# Patient Record
Sex: Female | Born: 1965 | Race: White | Hispanic: No | Marital: Married | Smoking: Never smoker
Health system: Southern US, Community
[De-identification: ages and names within clinical notes are randomized; demographics above are authoritative.]

## PROBLEM LIST (undated history)

## (undated) DIAGNOSIS — R7989 Other specified abnormal findings of blood chemistry: Secondary | ICD-10-CM

## (undated) HISTORY — DX: Other specified abnormal findings of blood chemistry: R79.89

## (undated) HISTORY — PX: OTHER SURGICAL HISTORY: SHX169

---

## 2015-08-13 DIAGNOSIS — M7750 Other enthesopathy of unspecified foot: Secondary | ICD-10-CM

## 2015-08-13 HISTORY — DX: Other enthesopathy of unspecified foot and ankle: M77.50

## 2018-01-04 DIAGNOSIS — M65872 Other synovitis and tenosynovitis, left ankle and foot: Secondary | ICD-10-CM | POA: Diagnosis not present

## 2018-01-04 DIAGNOSIS — M25572 Pain in left ankle and joints of left foot: Secondary | ICD-10-CM | POA: Diagnosis not present

## 2018-08-03 DIAGNOSIS — H2511 Age-related nuclear cataract, right eye: Secondary | ICD-10-CM | POA: Diagnosis not present

## 2020-05-10 ENCOUNTER — Ambulatory Visit: Payer: Self-pay | Admitting: Physician Assistant

## 2020-05-10 ENCOUNTER — Encounter: Payer: Self-pay | Admitting: Nurse Practitioner

## 2020-05-10 ENCOUNTER — Other Ambulatory Visit: Payer: Self-pay

## 2020-05-10 ENCOUNTER — Ambulatory Visit (INDEPENDENT_AMBULATORY_CARE_PROVIDER_SITE_OTHER): Payer: Managed Care, Other (non HMO) | Admitting: Nurse Practitioner

## 2020-05-10 VITALS — BP 110/64 | HR 129 | Temp 97.5°F | Ht 65.0 in | Wt 188.0 lb

## 2020-05-10 DIAGNOSIS — Z9981 Dependence on supplemental oxygen: Secondary | ICD-10-CM

## 2020-05-10 DIAGNOSIS — J9611 Chronic respiratory failure with hypoxia: Secondary | ICD-10-CM | POA: Diagnosis not present

## 2020-05-10 DIAGNOSIS — Z1231 Encounter for screening mammogram for malignant neoplasm of breast: Secondary | ICD-10-CM

## 2020-05-10 DIAGNOSIS — R0609 Other forms of dyspnea: Secondary | ICD-10-CM

## 2020-05-10 DIAGNOSIS — R5382 Chronic fatigue, unspecified: Secondary | ICD-10-CM | POA: Diagnosis not present

## 2020-05-10 DIAGNOSIS — Z7689 Persons encountering health services in other specified circumstances: Secondary | ICD-10-CM | POA: Diagnosis not present

## 2020-05-10 DIAGNOSIS — U099 Post covid-19 condition, unspecified: Secondary | ICD-10-CM

## 2020-05-10 DIAGNOSIS — R0981 Nasal congestion: Secondary | ICD-10-CM

## 2020-05-10 MED ORDER — LORATADINE 10 MG PO TABS: 10.0000 mg | ORAL_TABLET | Freq: Every day | ORAL | 0 refills | Status: DC

## 2020-05-10 NOTE — Progress Notes (Signed)
New Patient Office Visit  Subjective:  Patient ID: Sonya Francis, female    DOB: November 15, 1965  Age: 55 y.o. MRN: [id number]  CC:  Chief Complaint  Patient presents with  . 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 She denies chronic medical problems. She has not had preventative screenings or routine medical examinations in several years. She was diagnosed with COVID-19 and flu on 04/15/20 and subsequently hospitalized from 04/15/20-04/25/20 after worsening. She was discharged with continuous home O2 at 2L/min via [place] and increases it to 4L/min with activity. States she has dyspnea and fatigue with any activity. Positive for orthopnea and tachycardia. Pulse 129 in office today. She states she is independent with ADLs. Yessenia states she can scramble an egg and rest prior to eating.States she can wash dishes with resting intermittently. She denies follow-up appointments with pulmonology or cardiology post discharge.  She tells me her spouse was diagnosed and hospitalized at the same time she was. Unfortunately, she lost her mother to COVID-19 on 12/02/19 and brother on 12/13/19. She has declined screening colonoscopy referral but has agreed to mammogram in late March. She tells me her mother had breast cancer twice and father passed of esophageal cancer. She cannot recall last pap smear. Denies history of abnormal pap smear.    History reviewed. No pertinent past medical history.  History reviewed. No pertinent surgical history.  Family History  Problem Relation Age of Onset  . Cancer Mother   . Cancer Father     Social History   Socioeconomic History  . Marital status: Married    Spouse name: Not on file  . Number of children: Not on file  . Years of education: Not on file  . Highest education level: Not on file  Occupational History  . Not on file  Tobacco Use  . Smoking  status: Never Smoker  . Smokeless tobacco: Never Used  Vaping Use  . Vaping Use: Never used  Substance and Sexual Activity  . Alcohol use: Never  . Drug use: Never  . Sexual activity: Not on file  Other Topics Concern  . Not on file  Social History Narrative  . Not on file   Social Determinants of Health   Financial Resource Strain: Not on file  Food Insecurity: Not on file  Transportation Needs: Not on file  Physical Activity: Not on file  Stress: Not on file  Social Connections: Not on file  Intimate Partner Violence: Not on file    ROS Review of Systems  Constitutional: Positive for activity change, appetite change and fatigue.  HENT: Positive for congestion, ear pain (bilateral ear fullness), sinus pressure, sinus pain and tinnitus (right ear intermittently). Negative for sore throat.   Eyes: Negative for pain.  Respiratory: Positive for shortness of breath. Negative for cough, chest tightness and wheezing.   Cardiovascular: Negative for chest pain.  Gastrointestinal: Negative for abdominal pain, constipation, diarrhea, nausea and vomiting.  Endocrine: Negative for cold intolerance, heat intolerance, polydipsia and polyphagia.  Genitourinary: Negative for dysuria, frequency and urgency.  Musculoskeletal: Negative for arthralgias, back pain and myalgias.  Skin: Negative.   Allergic/Immunologic: Negative for environmental allergies and immunocompromised state.  Neurological: Positive for weakness. Negative for dizziness, seizures and headaches.  Hematological: Negative for adenopathy.  Psychiatric/Behavioral: Negative for agitation and sleep disturbance. The patient is not nervous/anxious.     Objective:   Today's  Vitals: BP 110/64 (BP Location: Right Arm, Patient Position: Sitting, Cuff Size: Normal)   Pulse (!) 129   Temp (!) 97.5 F (36.4 C) (Temporal)   Ht 5\' 5"  (1.651 m)   Wt 188 lb (85.3 kg)   SpO2 97% Comment: 2L oxygen  BMI 31.28 kg/m   Physical  Exam Vitals reviewed.  Constitutional:      Appearance: Normal appearance. She is ill-appearing.  HENT:     Head: Normocephalic.  Eyes:     Pupils: Pupils are equal, round, and reactive to light.  Neck:     Thyroid: No thyromegaly.  Cardiovascular:     Rate and Rhythm: Regular rhythm. Tachycardia present.  Pulmonary:     Effort: Pulmonary effort is normal. No respiratory distress.     Breath sounds: Examination of the right-upper field reveals rales. Examination of the left-upper field reveals rales. Decreased breath sounds and rales present.     Comments: Coarse lung sounds in bilateral lobes; O2 at @L /min via [place] in place Abdominal:     General: Bowel sounds are normal.     Palpations: Abdomen is soft.  Musculoskeletal:     Cervical back: Neck supple.  Lymphadenopathy:     Cervical: No cervical adenopathy.  Skin:    General: Skin is warm.     Capillary Refill: Capillary refill takes less than 2 seconds.  Neurological:     General: No focal deficit present.     Mental Status: She is alert.  Psychiatric:        Mood and Affect: Mood normal.        Behavior: Behavior normal.     Assessment & Plan:    1. COVID-19 long hauler manifesting chronic dyspnea - CBC with Differential/Platelet - Comprehensive metabolic panel - TSH - DG Chest 2 View  2. COVID-19 long hauler manifesting chronic fatigue - Vitamin D, 25-hydroxy  3. Chronic respiratory failure with hypoxia, on home oxygen therapy (HCC) - DG Chest 2 View -Rest in between periods of activity  4. Encounter to establish care with new doctor - Lipid Panel  5. Encounter for screening mammogram for malignant neoplasm of breast - MM DIGITAL SCREENING BILATERAL  6. Sinus congestion - loratadine (CLARITIN) 10 MG tablet; Take 1 tablet (10 mg total) by mouth daily.  Dispense: 90 tablet; Refill: 0     Outpatient Encounter Medications as of 05/10/2020  Medication Sig  . Ascorbic Acid (VITAMIN C) 1000 MG tablet Take  1,000 mg by mouth daily.  . Cholecalciferol (VITAMIN D) 50 MCG (2000 UT) tablet Take 2,000 Units by mouth daily.  . [employer] MUCUS EXTENDED RELEASE 600 MG 12 hr tablet TAKE 1 TABLET BY MOUTH EVERY 12 HOURS AS NEEDED FOR CONGESTION  . torsemide (DEMADEX) 5 MG tablet Take 5 mg by mouth daily.  . Zinc Sulfate (ZINC-220 PO) Take by mouth.   No facility-administered encounter medications on file as of 05/10/2020.   Return tomorrow morning for fasting labs Get chest xray at [facility] tomorrow We will call you with mammogram appointment and lab results Continue medications and oxygen as directed Return in 4-weeks for follow-up  Follow-up: 4-weeks or sooner if needed   Signed, 07/08/2020, DNP May 10, 2020 at 4:06 p.m.

## 2020-05-10 NOTE — Patient Instructions (Addendum)
Return tomorrow morning for fasting labs [facility] tomorrow We will call you with mammogram appointment and lab results Continue medications and oxygen as directed Return in 4-weeks for follow-up   Home Oxygen Use, Adult When a medical condition keeps you from getting enough oxygen, your health care provider may instruct you to take extra oxygen at home. Your health care provider will let you know:  When to take oxygen.  How long to take oxygen.  How quickly oxygen should be delivered (flow rate), in liters per minute (LPM or L/M). Home oxygen can be given through:  A mask.  A nasal cannula. This is a device or tube that goes in the nostrils.  A transtracheal catheter. This is a small, thin tube placed in the windpipe (trachea).  A breathing tube (tracheostomy tube) that is surgically placed in the windpipe. This may be used in severe cases. These devices are connected with tubing to an oxygen source, such as:  A tank. Tanks hold oxygen in gas form. They must be replaced when the oxygen is used up.  A liquid oxygen device. This holds oxygen in liquid form. Liquid oxygen is very cold. It must be replaced when the oxygen is used up.  An oxygen concentrator machine. This filters oxygen in the room. There are two types of oxygen concentrator machines--stationary and portable. ? A stationary oxygen concentrator machine plugs into the main electricity supply at your home. You must have a backup cylinder of oxygen in case the power goes out. ? A portable oxygen concentrator machine is smaller in size and more lightweight. This machine uses battery supply and can be used outside the home. Work with your health care provider to find equipment that works best for you and your lifestyle. What are the risks? Delivery of supplemental oxygen is generally safe. However, some risks include:  Fire. This can happen if the oxygen is exposed to a heat source, flame, or  spark.  Injury to skin. This can happen if liquid oxygen touches your skin.  Damage to the lungs or other organs. This can happen from getting too little or too much oxygen. Supplies needed: To use oxygen, you will need:  A mask, nasal cannula, transtracheal catheter, or tracheostomy.  An oxygen tank, a liquid oxygen device, or an oxygen concentrator.  The tape that your health care provider recommends (optional). Your health care provider may also recommend:  A humidifier to warm and moisten the oxygen delivered. This will depend on how much oxygen you need and the type of home oxygen device you use.  A pulse oximeter. This device measures the percentage of oxygen in your blood. How to use oxygen Your health care provider or a person from your medical device company will show you how to use your oxygen device. Follow his or her instructions. The instructions may look something like this: 1. Wash your hands with soap and water. 2. If you use an oxygen concentrator, make sure it is plugged in. 3. Place one end of the tube into the port on the tank, device, or machine. 4. Place the mask over your nose and mouth. Or, place the nasal cannula and secure it with tape if instructed. If you use a tracheostomy or transtracheal catheter, connect it to the oxygen source as directed. 5. Make sure the liter-flow setting on the machine is at the level prescribed by your health care provider. 6. Turn on the machine or adjust the knob on the  tank or device to the correct liter-flow setting. 7. When you are done, turn off and unplug the machine, or turn the knob to OFF.   How to clean and care for the oxygen supplies Nasal cannula  Clean it with a warm, wet cloth daily or as needed.  Wash it with a liquid soap once a week.  Rinse it thoroughly once or twice a week.  Air-dry it.  Replace it every 2-4 weeks.  If you have an infection, such as a cold or pneumonia, change the cannula when you get  better. Mask  Replace it every 2-4 weeks.  If you have an infection, such as a cold or pneumonia, change the mask when you get better. Humidifier bottle  Wash the bottle between each refill: ? Wash it with soap and warm water. ? Rinse it thoroughly. ? Clean it and its top with a disinfectant cleaner. ? Air-dry it. ? Make sure it is dry before you refill it. Oxygen concentrator  Clean the air filter at least twice a week according to directions from your home medical equipment and service company.  Wipe down the cabinet every day. To do this: ? Unplug the unit. ? Wipe down the cabinet with a damp cloth. ? Dry the cabinet. Other equipment  Change any extra tubing every 1-3 months.  Follow instructions from your health care provider about taking care of any other equipment. Safety tips Fire safety tips  Keep your oxygen and oxygen supplies at least 6 ft (2 m) away from sources of heat, flames, and sparks at all times.  Do not allow smoking near your oxygen. Put up "no smoking" signs in your home. Avoid smoking areas when in public.  Do not use materials that can burn (are flammable) while you use oxygen. This includes: ? Petroleum jelly. ? Hair spray or other aerosol sprays. ? Rubbing alcohol. ? Hand sanitizer.  When you go to a restaurant with portable oxygen, ask to be seated in the non-smoking section.  Keep a Government social research officer close by. Let your fire department know that you have oxygen in your home.  Test your home smoke detectors regularly.   Traveling  Secure your oxygen tank in the vehicle so that it does not move around. Follow instructions from your medical device company about how to safely secure your tank.  Make sure you have enough oxygen for the amount of time you will be away from home.  If you are planning to travel by public transportation (airplane, train, bus, or boat), contact the company to find out if it allows the use of an approved portable  oxygen concentrator. You may also need documents from your health care provider and medical device company before you travel. General safety tips  If you use an oxygen cylinder, make sure it is in a stand or secured to an object that will not move (fixed object).  If you use liquid oxygen, make sure its container is kept upright at all times.  If you use an oxygen concentrator: ? Catering manager company. Make sure you are given priority service in the event that your power goes out. ? Avoid using extension cords if possible. Follow these instructions at home:  Use oxygen only as told by your health care provider.  Do not use alcohol or other drugs that make you relax (sedating drugs) unless instructed. They can slow down your breathing rate and make it hard to get in enough oxygen.  Know how and  when to order a refill of oxygen.  Always keep a spare tank of oxygen. Plan ahead for holidays when you may not be able to get a prescription filled.  Use water-based lubricants on your lips or nostrils. Do not use oil-based products like petroleum jelly.  To prevent skin irritation on your cheeks or behind your ears, tuck some gauze under the tubing. Where to find more information  American Lung Association: [employer] Contact a health care provider if:  You get headaches often.  You have a lasting cough.  You are restless or have anxiety.  You develop an illness that affects your breathing.  You cannot exercise at your regular level.  You have a fever.  You have persistent redness under your nose. Get help right away if:  You are confused.  You are sleepy all the time.  You have blue lips or fingernails.  You have difficult or irregular breathing that is getting worse.  You are struggling to breathe. These symptoms may represent a serious problem that is an emergency. Do not wait to see if the symptoms will go away. Get medical help right away. Call your local  emergency services (911 in the [place] Do not drive yourself to the hospital. Summary  Your health care provider or a person from your medical device company will show you how to use your oxygen device. Follow his or her instructions.  If you use an oxygen concentrator, make sure it is plugged in.  Make sure the liter-flow setting on the machine is at the level prescribed by your health care provider.  Use oxygen only as told by your health care provider.  Keep your oxygen and oxygen supplies at least 6 ft (2 m) away from sources of heat, flames, and sparks at all times. This information is not intended to replace advice given to you by your health care provider. Make sure you discuss any questions you have with your health care provider. Document Revised: 05/26/2019 Document Reviewed: 03/22/2019 Elsevier Patient Education  2021 Elsevier Inc.   Chest X-Ray A chest X-ray is a test that uses radiation to create pictures of the organs in your chest, including the lungs, the heart, and the ribs. Chest X-rays are used to look for many health conditions, including heart failure, pneumonia, tuberculosis, rib fractures, breathing disorders, and cancer. They may be used to diagnose chest pain, constant coughing, or trouble breathing. Tell a health care provider about:  Any allergies you have.  All medicines you are taking, including vitamins, herbs, eye drops, creams, and over-the-counter medicines.  Any surgeries you have had.  Any medical conditions you have.  Whether you are pregnant or may be pregnant. What are the risks? Getting a chest X-ray is a safe procedure. However, you will be exposed to a small amount of radiation. Being exposed to too much radiation over a lifetime can increase the risk of cancer. This risk is small, but it may occur if you have many X-rays throughout your life. What happens before the procedure?  You may be asked to remove glasses, jewelry, and any other  metal objects.  You will be asked to undress from the waist up. You may be given a hospital gown to wear.  You may be asked to wear a protective lead apron to protect other parts of your body from radiation. What happens during the procedure?  You will be asked to stand still as each picture is taken. This ensures that good pictures are taken.  You will be asked to take a deep breath and hold it for a few seconds.  The X-ray machine will create a picture of your chest using a tiny burst of radiation. This is painless.  More pictures may be taken from other angles. Typically, one picture will be taken while you face the X-ray camera, and another picture will be taken from the side while you stand. If you cannot stand, you may be asked to lie down. The procedure may vary among health care providers and hospitals.   What can I expect after procedure?  The X-ray will be reviewed by your health care provider or an X-ray specialist (radiologist).  You may return to your normal, everyday life, including diet, activities, and medicines, unless your health care provider tells you not to do that.  It is up to you to get your test results. Ask your health care provider, or the department that is doing the procedure, when your results will be ready.  Your health care provider will tell you if you need more tests or a follow-up exam. Keep all follow-up visits. This is important. Summary  A chest X-ray is a safe, painless test that uses radiation to create pictures of the organs inside your chest, including the lungs, heart, and ribs.  You will need to undress from the waist up and remove jewelry and metal objects before the procedure.  You will be exposed to a small amount of radiation during the procedure.  The X-ray machine will take one or more pictures of your chest while you remain as still as possible.  Later, a health care provider or specialist will review the test results with  you. This information is not intended to replace advice given to you by your health care provider. Make sure you discuss any questions you have with your health care provider. Document Revised: 06/24/2019 Document Reviewed: 06/24/2019 Elsevier Patient Education  2021 Elsevier Inc.    Mammogram A mammogram is an X-ray of the breasts. This is done to check for changes that are not normal. This test can look for changes that may be caused by breast cancer or other problems. Mammograms are regularly done on women beginning at age 38. A man may have a mammogram if he has a lump or swelling in his breast. Tell a doctor:  About any allergies you have.  If you have breast implants.  If you have had breast disease, biopsy, or surgery.  If you have a family history of breast cancer.  If you are breastfeeding.  Whether you are pregnant or may be pregnant. What are the risks? Generally, this is a safe procedure. But problems may occur, including:  Being exposed to radiation. Radiation levels are very low with this test.  The need for more tests.  The results were not read properly.  Trouble finding breast cancer in women with dense breasts. What happens before the test?  Have this test done about 1-2 weeks after your menstrual period. This is often when your breasts are the least tender.  If you are visiting a new doctor or clinic, have any past mammogram images sent to your new doctor's office.  Wash your breasts and under your arms on the day of the test.  Do not use deodorants, perfumes, lotions, or powders on the day of the test.  Take off any jewelry from your neck.  Wear clothes that you can change into and out of easily. What happens during the test?  You will take off your clothes from the waist up. You will put on a gown.  You will stand in front of the X-ray machine.  Each breast will be placed between two plastic or glass plates. The plates will press down on your  breast for a few seconds. Try to relax. This does not cause any harm to your breasts. It may not feel comfortable, but it will be very brief.  X-rays will be taken from different angles of each breast. The procedure may vary among doctors and hospitals.   What can I expect after the test?  The mammogram will be read by a specialist (radiologist).  You may need to do parts of the test again. This depends on the quality of the images.  You may go back to your normal activities.  It is up to you to get the results of your test. Ask how to get your results when they are ready. Summary  A mammogram is an X-ray of the breasts. It looks for changes that may be caused by breast cancer or other problems.  A man may have this test if he has a lump or swelling in his breast.  Before the test, tell your doctor about any breast problems that you have had in the past.  Have this test done about 1-2 weeks after your menstrual period.  Ask when your test results will be ready. Make sure you get your test results. This information is not intended to replace advice given to you by your health care provider. Make sure you discuss any questions you have with your health care provider. Document Revised: 01/23/2020 Document Reviewed: 01/23/2020 Elsevier Patient Education  2021 [employer].

## 2020-05-11 ENCOUNTER — Other Ambulatory Visit: Payer: Managed Care, Other (non HMO)

## 2020-05-12 ENCOUNTER — Other Ambulatory Visit: Payer: Self-pay | Admitting: Nurse Practitioner

## 2020-05-12 DIAGNOSIS — R7989 Other specified abnormal findings of blood chemistry: Secondary | ICD-10-CM

## 2020-05-12 DIAGNOSIS — E559 Vitamin D deficiency, unspecified: Secondary | ICD-10-CM

## 2020-05-12 LAB — COMPREHENSIVE METABOLIC PANEL
ALT: 25 IU/L (ref 0–32)
AST: 16 IU/L (ref 0–40)
Albumin/Globulin Ratio: 1.4 (ref 1.2–2.2)
Albumin: 4.1 g/dL (ref 3.8–4.9)
Alkaline Phosphatase: 105 IU/L (ref 44–121)
BUN/Creatinine Ratio: 15 (ref 9–23)
BUN: 11 mg/dL (ref 6–24)
Bilirubin Total: 0.5 mg/dL (ref 0.0–1.2)
CO2: 20 mmol/L (ref 20–29)
Calcium: 9.3 mg/dL (ref 8.7–10.2)
Chloride: 102 mmol/L (ref 96–106)
Creatinine, Ser: 0.74 mg/dL (ref 0.57–1.00)
GFR calc Af Amer: 104 mL/min/{1.73_m2} (ref >59)
GFR calc non Af Amer: 90 mL/min/{1.73_m2} (ref >59)
Globulin, Total: 2.9 g/dL (ref 1.5–4.5)
Glucose: 108 mg/dL — ABNORMAL HIGH (ref 65–99)
Potassium: 4.1 mmol/L (ref 3.5–5.2)
Sodium: 140 mmol/L (ref 134–144)
Total Protein: 7 g/dL (ref 6.0–8.5)

## 2020-05-12 LAB — CBC WITH DIFFERENTIAL/PLATELET
Basophils Absolute: 0 10*3/uL (ref 0.0–0.2)
Basos: 1 %
EOS (ABSOLUTE): 0.1 10*3/uL (ref 0.0–0.4)
Eos: 2 %
Hematocrit: 41.7 % (ref 34.0–46.6)
Hemoglobin: 13.4 g/dL (ref 11.1–15.9)
Immature Grans (Abs): 0 10*3/uL (ref 0.0–0.1)
Immature Granulocytes: 0 %
Lymphocytes Absolute: 2 10*3/uL (ref 0.7–3.1)
Lymphs: 30 %
MCH: 29.2 pg (ref 26.6–33.0)
MCHC: 32.1 g/dL (ref 31.5–35.7)
MCV: 91 fL (ref 79–97)
Monocytes Absolute: 0.6 10*3/uL (ref 0.1–0.9)
Monocytes: 9 %
Neutrophils Absolute: 4 10*3/uL (ref 1.4–7.0)
Neutrophils: 58 %
Platelets: 247 10*3/uL (ref 150–450)
RBC: 4.59 x10E6/uL (ref 3.77–5.28)
RDW: 13.4 % (ref 11.7–15.4)
WBC: 6.8 10*3/uL (ref 3.4–10.8)

## 2020-05-12 LAB — TSH: TSH: 7.14 u[IU]/mL — ABNORMAL HIGH (ref 0.450–4.500)

## 2020-05-12 LAB — LIPID PANEL
Chol/HDL Ratio: 4.1 ratio (ref 0.0–4.4)
Cholesterol, Total: 190 mg/dL (ref 100–199)
HDL: 46 mg/dL (ref >39)
LDL Chol Calc (NIH): 123 mg/dL — ABNORMAL HIGH (ref 0–99)
Triglycerides: 114 mg/dL (ref 0–149)
VLDL Cholesterol Cal: 21 mg/dL (ref 5–40)

## 2020-05-12 LAB — CARDIOVASCULAR RISK ASSESSMENT

## 2020-05-12 LAB — VITAMIN D 25 HYDROXY (VIT D DEFICIENCY, FRACTURES): Vit D, 25-Hydroxy: 27.1 ng/mL — ABNORMAL LOW (ref 30.0–100.0)

## 2020-05-12 MED ORDER — VITAMIN D (ERGOCALCIFEROL) 1.25 MG (50000 UNIT) PO CAPS: 50000.0000 [IU] | ORAL_CAPSULE | ORAL | 2 refills | Status: DC

## 2020-05-13 ENCOUNTER — Other Ambulatory Visit: Payer: Self-pay | Admitting: Nurse Practitioner

## 2020-05-13 DIAGNOSIS — R0981 Nasal congestion: Secondary | ICD-10-CM

## 2020-05-14 ENCOUNTER — Telehealth: Payer: Self-pay | Admitting: Nurse Practitioner

## 2020-05-14 DIAGNOSIS — J1282 Pneumonia due to coronavirus disease 2019: Secondary | ICD-10-CM

## 2020-05-14 MED ORDER — LEVOFLOXACIN 750 MG PO TABS: 750.0000 mg | ORAL_TABLET | Freq: Every day | ORAL | 0 refills | Status: DC

## 2020-05-14 NOTE — Telephone Encounter (Signed)
Multifocal pneumonia CXR report obtained from [facility]. Levaquin prescription sent to pharmacy. Pt notified via phone.

## 2020-06-01 ENCOUNTER — Other Ambulatory Visit: Payer: Self-pay

## 2020-06-01 MED ORDER — TORSEMIDE 5 MG PO TABS
5.0000 mg | ORAL_TABLET | Freq: Every day | ORAL | 0 refills | Status: DC
Start: 2020-06-01 — End: 2020-08-29

## 2020-06-01 NOTE — Telephone Encounter (Signed)
Pt calling requesting "fluid pill" be refilled. Pending. Pt is also requesting update of short/long term disability paperwork. Please advise.  Lorita Officer, [place] 06/01/20 12:04 PM

## 2020-06-04 NOTE — Telephone Encounter (Signed)
Attempted to call pt. No answer, left VM for call back to clinic.   Lorita Officer, [place] 06/04/20 2:54 PM

## 2020-06-05 LAB — T4, FREE

## 2020-06-05 LAB — SPECIMEN STATUS REPORT

## 2020-06-12 ENCOUNTER — Other Ambulatory Visit: Payer: Self-pay

## 2020-06-12 ENCOUNTER — Ambulatory Visit: Payer: Managed Care, Other (non HMO) | Admitting: Nurse Practitioner

## 2020-06-12 ENCOUNTER — Encounter: Payer: Self-pay | Admitting: Nurse Practitioner

## 2020-06-12 VITALS — BP 128/82 | HR 124 | Temp 97.8°F | Ht 65.0 in | Wt 204.0 lb

## 2020-06-12 DIAGNOSIS — Z9981 Dependence on supplemental oxygen: Secondary | ICD-10-CM

## 2020-06-12 DIAGNOSIS — R0609 Other forms of dyspnea: Secondary | ICD-10-CM | POA: Diagnosis not present

## 2020-06-12 DIAGNOSIS — R000 Tachycardia, unspecified: Secondary | ICD-10-CM

## 2020-06-12 DIAGNOSIS — U099 Post covid-19 condition, unspecified: Secondary | ICD-10-CM

## 2020-06-12 DIAGNOSIS — R002 Palpitations: Secondary | ICD-10-CM | POA: Diagnosis not present

## 2020-06-12 DIAGNOSIS — J9611 Chronic respiratory failure with hypoxia: Secondary | ICD-10-CM

## 2020-06-12 DIAGNOSIS — R7989 Other specified abnormal findings of blood chemistry: Secondary | ICD-10-CM

## 2020-06-12 DIAGNOSIS — Z596 Low income: Secondary | ICD-10-CM

## 2020-06-12 MED ORDER — METOPROLOL SUCCINATE ER 25 MG PO TB24: 25.0000 mg | ORAL_TABLET | Freq: Every day | ORAL | 0 refills | Status: DC

## 2020-06-12 NOTE — Patient Instructions (Signed)
Begin Metoprolol 25 mg daily for fast heart rate Monitor BP and pulse at home; Notify office if above 140/90. we want BP 120s/70s Normal pulse (heartbeat) is between 60-100 We will call you with thyroid lab results and appointments for Cardiology (heart doctor) and Pulmonology (lung doctor)   Sinus Tachycardia  Sinus tachycardia is a kind of fast heartbeat. In sinus tachycardia, the heart beats more than 100 times a minute. Sinus tachycardia starts in a part of the heart called the sinus node. Sinus tachycardia may be harmless, or it may be a sign of a serious condition. What are the causes? This condition may be caused by:  Exercise or exertion.  A fever.  Pain.  Loss of body fluids (dehydration).  Severe bleeding (hemorrhage).  Anxiety and stress.  Certain substances, including: ? Alcohol. ? Caffeine. ? Tobacco and nicotine products. ? Cold medicines. ? Illegal drugs.  Medical conditions including: ? Heart disease. ? An infection. ? An overactive thyroid (hyperthyroidism). ? A lack of red blood cells (anemia). What are the signs or symptoms? Symptoms of this condition include:  A feeling that the heart is beating quickly (palpitations).  Suddenly noticing your heartbeat (cardiac awareness).  Dizziness.  Tiredness (fatigue).  Shortness of breath.  Chest pain.  Nausea.  Fainting. How is this diagnosed? This condition is diagnosed with:  A physical exam.  Other tests, such as: ? Blood tests. ? An electrocardiogram (ECG). This test measures the electrical activity of the heart. ? Ambulatory cardiac monitor. This records your heartbeats for 24 hours or more. You may be referred to a heart specialist (cardiologist). How is this treated? Treatment for this condition depends on the cause or the underlying condition. Treatment may involve:  Treating the underlying condition.  Taking new medicines or changing your current medicines as told by your health  care provider.  Making changes to your diet or lifestyle. Follow these instructions at home: Lifestyle  Do not use any products that contain nicotine or tobacco, such as cigarettes and e-cigarettes. If you need help quitting, ask your health care provider.  Do not use illegal drugs, such as cocaine.  Learn relaxation methods to help you when you get stressed or anxious. These include deep breathing.  Avoid caffeine or other stimulants.   Alcohol use  Do not drink alcohol if: ? Your health care provider tells you not to drink. ? You are pregnant, may be pregnant, or are planning to become pregnant.  If you drink alcohol, limit how much you have: ? 0-1 drink a day for women. ? 0-2 drinks a day for men.  Be aware of how much alcohol is in your drink. In the [place] one drink equals one typical bottle of beer (12 oz), one-half glass of wine (5 oz), or one shot of hard liquor (1 oz).   General instructions  Drink enough fluids to keep your urine pale yellow.  Take over-the-counter and prescription medicines only as told by your health care provider.  Keep all follow-up visits as told by your health care provider. This is important. Contact a health care provider if you have:  A fever.  Vomiting or diarrhea that does not go away. Get help right away if you:  Have pain in your chest, upper arms, jaw, or neck.  Become weak or dizzy.  Feel faint.  Have palpitations that do not go away. Summary  In sinus tachycardia, the heart beats more than 100 times a minute.  Sinus tachycardia may be harmless,  or it may be a sign of a serious condition.  Treatment for this condition depends on the cause or the underlying condition.  Get help right away if you have pain in your chest, upper arms, jaw, or neck. This information is not intended to replace advice given to you by your health care provider. Make sure you discuss any questions you have with your health care provider. Document  Revised: 05/13/2017 Document Reviewed: 05/13/2017 Elsevier Patient Education  2021 Elsevier Inc. Metoprolol Tablets What is this medicine? METOPROLOL (me TOE proe lole) is a beta blocker. It decreases the amount of work your heart has to do and helps your heart beat regularly. It is used to treat high blood pressure and/or prevent chest pain (also called angina). It is also used after a heart attack to prevent a second one. This medicine may be used for other purposes; ask your health care provider or pharmacist if you have questions. COMMON BRAND NAME(S): Lopressor What should I tell my health care provider before I take this medicine? They need to know if you have any of these conditions:  diabetes  heart or vessel disease like slow heart rate, worsening heart failure, heart block, sick sinus syndrome or Raynaud's disease  kidney disease  liver disease  lung or breathing disease, like asthma or emphysema  pheochromocytoma  thyroid disease  an unusual or allergic reaction to metoprolol, other beta-blockers, medicines, foods, dyes, or preservatives  pregnant or trying to get pregnant  breast-feeding How should I use this medicine? Take this drug by mouth with water. Take it as directed on the prescription label at the same time every day. You can take it with or without food. You should always take it the same way. Keep taking it unless your health care provider tells you to stop. Talk to your health care provider about the use of this drug in children. Special care may be needed. Overdosage: If you think you have taken too much of this medicine contact a poison control center or emergency room at once. NOTE: This medicine is only for you. Do not share this medicine with others. What if I miss a dose? If you miss a dose, take it as soon as you can. If it is almost time for your next dose, take only that dose. Do not take double or extra doses. What may interact with this  medicine? This medicine may interact with the following medications:  certain medicines for blood pressure, heart disease, irregular heart beat  certain medicines for depression like monoamine oxidase (MAO) inhibitors, fluoxetine, or paroxetine  clonidine  dobutamine  epinephrine  isoproterenol  reserpine This list may not describe all possible interactions. Give your health care provider a list of all the medicines, herbs, non-prescription drugs, or dietary supplements you use. Also tell them if you smoke, drink alcohol, or use illegal drugs. Some items may interact with your medicine. What should I watch for while using this medicine? Visit your doctor or health care professional for regular check ups. Contact your doctor right away if your symptoms worsen. Check your blood pressure and pulse rate regularly. Ask your health care professional what your blood pressure and pulse rate should be, and when you should contact them. You may get drowsy or dizzy. Do not drive, use machinery, or do anything that needs mental alertness until you know how this medicine affects you. Do not sit or stand up quickly, especially if you are an older patient. This reduces the risk  of dizzy or fainting spells. Contact your doctor if these symptoms continue. Alcohol may interfere with the effect of this medicine. Avoid alcoholic drinks. This medicine may increase blood sugar. Ask your healthcare provider if changes in diet or medicines are needed if you have diabetes. What side effects may I notice from receiving this medicine? Side effects that you should report to your doctor or health care professional as soon as possible:  allergic reactions like skin rash, itching or hives  cold or numb hands or feet  depression  difficulty breathing  faint  fever with sore throat  irregular heartbeat, chest pain  rapid weight gain  signs and symptoms of high blood sugar such as being more thirsty or hungry  or having to urinate more than normal. You may also feel very tired or have blurry vision.  swollen legs or ankles Side effects that usually do not require medical attention (report to your doctor or health care professional if they continue or are bothersome):  anxiety or nervousness  change in sex drive or performance  dry skin  headache  nightmares or trouble sleeping  short term memory loss  stomach upset or diarrhea This list may not describe all possible side effects. Call your doctor for medical advice about side effects. You may report side effects to FDA at 1-800-FDA-1088. Where should I keep my medicine? Keep out of the reach of children and pets. Store at room temperature between 15 and 30 degrees C (59 and 86 degrees F). Protect from moisture. Keep the container tightly closed. Throw away any unused drug after the expiration date. NOTE: This sheet is a summary. It may not cover all possible information. If you have questions about this medicine, talk to your doctor, pharmacist, or health care provider.  2021 Elsevier/Gold Standard (2018-11-04 17:21:17)

## 2020-06-12 NOTE — Progress Notes (Signed)
Established Patient Office Visit  Subjective:  Patient ID: Sonya Francis, female    DOB: November 10, 1965  Age: 55 y.o. 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 She was hospitalized on 04/15/20-04/25/20 with COVID-19 pneumonia and flu. She is O2 dependent on 2L/min via [place] continuously and 4L/min with activity.Denies previous smoking history. States she has gradually increased ability to perform showering without resting intermittently. She is experiencing tachycardia with rest. Denies f/u appointments with pulmonology or cardiology after hospital discharge. States she has been unable to work since late December due to COVID-19. States she has not had any financial income since Dec 22nd, 2021 which has caused financial strain and increased stress.  TSH was found to be elevated on initial visit 05/10/20. T4 was ordered but was cancelled erroneously. Pt denies history of thyroid disease. Pt states "I never went to the doctor before COVID-19".  Past Medical History:  Diagnosis Date  . Elevated TSH     No past surgical history on file.  Family History  Problem Relation Age of Onset  . Cancer Mother   . Cancer Father     Social History   Socioeconomic History  . Marital status: Married    Spouse name: Not on file  . Number of children: Not on file  . Years of education: Not on file  . Highest education level: Not on file  Occupational History  . Not on file  Tobacco Use  . Smoking status: Never Smoker  . Smokeless tobacco: Never Used  Vaping Use  . Vaping Use: Never used  Substance and Sexual Activity  . Alcohol use: Never  . Drug use: Never  . Sexual activity: Not on file  Other Topics Concern  . Not on file  Social History Narrative  . Not on file   Social Determinants of Health   Financial Resource Strain: Not on file  Food Insecurity: Not  on file  Transportation Needs: Not on file  Physical Activity: Not on file  Stress: Not on file  Social Connections: Not on file  Intimate Partner Violence: Not on file    Outpatient Medications Prior to Visit  Medication Sig Dispense Refill  . Ascorbic Acid (VITAMIN C) 1000 MG tablet Take 1,000 mg by mouth daily.    . Cholecalciferol (VITAMIN D) 50 MCG (2000 UT) tablet Take 2,000 Units by mouth daily.    . [employer] MUCUS EXTENDED RELEASE 600 MG 12 hr tablet TAKE 1 TABLET BY MOUTH EVERY 12 HOURS AS NEEDED FOR CONGESTION    . levofloxacin (LEVAQUIN) 750 MG tablet Take 1 tablet (750 mg total) by mouth daily. 7 tablet 0  . loratadine (CLARITIN) 10 MG tablet TAKE 1 TABLET BY MOUTH EVERY DAY 90 tablet 0  . torsemide (DEMADEX) 5 MG tablet Take 1 tablet (5 mg total) by mouth daily. 90 tablet 0  . Vitamin D, Ergocalciferol, (DRISDOL) 1.25 MG (50000 UNIT) CAPS capsule Take 1 capsule (50,000 Units total) by mouth every 7 (seven) days. 5 capsule 2  . Zinc Sulfate (ZINC-220 PO) Take by mouth.     No facility-administered medications prior to visit.    No Known Allergies  ROS Review of Systems  Constitutional: Positive for fatigue. Negative for chills and fever.  HENT: Positive for ear pain (right ear pressure). Negative for congestion, sinus pain and sore throat.   Respiratory: Negative for cough and shortness  of breath.        Dyspnea   Cardiovascular: Negative for chest pain.  Gastrointestinal: Negative for abdominal pain, constipation, diarrhea, nausea and vomiting.  Endocrine: Negative for cold intolerance, heat intolerance, polydipsia, polyphagia and polyuria.  Genitourinary: Negative for dysuria, frequency, pelvic pain and urgency.  Musculoskeletal: Negative for myalgias.  Skin: Negative.   Allergic/Immunologic: Positive for environmental allergies.  Neurological: Negative for headaches.  Hematological: Negative for adenopathy.  Psychiatric/Behavioral: Negative for decreased concentration  and self-injury. The patient is not nervous/anxious.       Objective:    BP (!) 148/82 (BP Location: Left Arm, Patient Position: Sitting)   Pulse (!) 124   Temp 97.8 F (36.6 C) (Temporal)   Ht 5\' 5"  (1.651 m)   Wt 204 lb (92.5 kg)   SpO2 98%   BMI 33.95 kg/m  Physical Exam Vitals reviewed.  Constitutional:      Appearance: Normal appearance.  HENT:     Head: Normocephalic.     Right Ear: Tympanic membrane normal.     Left Ear: Tympanic membrane normal.     Mouth/Throat:     Mouth: Mucous membranes are moist.  Neck:     Vascular: No carotid bruit.  Cardiovascular:     Rate and Rhythm: Regular rhythm. Tachycardia present.     Pulses: Normal pulses.     Heart sounds: Normal heart sounds.  Pulmonary:     Effort: Pulmonary effort is normal.     Breath sounds: No wheezing, rhonchi or rales.     Comments: Lung sounds diminished bilateral posterior lobes Abdominal:     General: Bowel sounds are normal.     Palpations: Abdomen is soft.     Tenderness: There is no abdominal tenderness. There is no guarding.  Musculoskeletal:        General: No swelling.  Skin:    General: Skin is warm and dry.     Capillary Refill: Capillary refill takes less than 2 seconds.  Neurological:     General: No focal deficit present.     Mental Status: She is alert and oriented to person, place, and time.  Psychiatric:        Mood and Affect: Mood normal.        Behavior: Behavior normal.      Wt Readings from Last 3 Encounters:  05/10/20 188 lb (85.3 kg)     Health Maintenance Due  Topic Date Due  . Hepatitis C Screening  Never done  . COVID-19 Vaccine (1) Never done  . HIV Screening  Never done  . TETANUS/TDAP  Never done  . PAP SMEAR-Modifier  Never done  . COLONOSCOPY (Pts 45-28yrs Insurance coverage will need to be confirmed)  Never done  . MAMMOGRAM  Never done     Lab Results  Component Value Date   TSH 7.140 (H) 05/11/2020   Lab Results  Component Value Date   WBC  6.8 05/11/2020   HGB 13.4 05/11/2020   HCT 41.7 05/11/2020   MCV 91 05/11/2020   PLT 247 05/11/2020   Lab Results  Component Value Date   NA 140 05/11/2020   K 4.1 05/11/2020   CO2 20 05/11/2020   GLUCOSE 108 (H) 05/11/2020   BUN 11 05/11/2020   CREATININE 0.74 05/11/2020   BILITOT 0.5 05/11/2020   ALKPHOS 105 05/11/2020   AST 16 05/11/2020   ALT 25 05/11/2020   PROT 7.0 05/11/2020   ALBUMIN 4.1 05/11/2020   CALCIUM 9.3 05/11/2020  Lab Results  Component Value Date   CHOL 190 05/11/2020   Lab Results  Component Value Date   HDL 46 05/11/2020   Lab Results  Component Value Date   LDLCALC 123 (H) 05/11/2020   Lab Results  Component Value Date   TRIG 114 05/11/2020   Lab Results  Component Value Date   CHOLHDL 4.1 05/11/2020       Assessment & Plan:   1. COVID-19 long hauler manifesting chronic dyspnea - Ambulatory referral to Pulmonology  2. 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 Tachycardia - Ambulatory referral to Cardiology - metoprolol succinate (TOPROL-XL) 25 MG 24 hr tablet; Take 1 tablet (25 mg total) by mouth daily.  Dispense: 90 tablet; Refill: 0  4. O2 dependent - Ambulatory referral to Pulmonology - Ambulatory referral to Cardiology  5. Chronic respiratory failure with hypoxia, on home oxygen therapy [facility]) - Ambulatory referral to Pulmonology - Ambulatory referral to Cardiology  6. Elevated TSH - Thyroid Panel With TSH  7. Low income     Begin Metoprolol 25 mg daily for fast heart rate Monitor BP and pulse at home; Notify office if above 140/90. we want BP 120s/70s Normal pulse (heartbeat) is between 60-100 We will call you with thyroid lab results and appointments for Cardiology (heart doctor) and Pulmonology (lung doctor)   Follow-up: 4-weeks    Janie Morning, NP

## 2020-06-13 LAB — THYROID PANEL WITH TSH
Free Thyroxine Index: 1.6 (ref 1.2–4.9)
T3 Uptake Ratio: 25 % (ref 24–39)
T4, Total: 6.3 ug/dL (ref 4.5–12.0)
TSH: 4.11 u[IU]/mL (ref 0.450–4.500)

## 2020-06-14 ENCOUNTER — Telehealth: Payer: Self-pay

## 2020-06-14 DIAGNOSIS — R7989 Other specified abnormal findings of blood chemistry: Secondary | ICD-10-CM | POA: Insufficient documentation

## 2020-06-14 NOTE — Telephone Encounter (Signed)
   Sonya Francis has been scheduled for the following appointment:  WHAT: Mammogram WHERE: [facility] DATE: 07/10/2020 TIME: 12pm  Left a voicemail for patient with the information needed for her appointment an our office number if she had any questions.

## 2020-06-15 ENCOUNTER — Other Ambulatory Visit: Payer: Self-pay | Admitting: Nurse Practitioner

## 2020-06-15 ENCOUNTER — Telehealth: Payer: Self-pay

## 2020-06-15 DIAGNOSIS — H6981 Other specified disorders of Eustachian tube, right ear: Secondary | ICD-10-CM

## 2020-06-15 DIAGNOSIS — H6991 Unspecified Eustachian tube disorder, right ear: Secondary | ICD-10-CM

## 2020-06-15 MED ORDER — PREDNISONE 50 MG PO TABS: ORAL_TABLET | ORAL | 0 refills | Status: DC

## 2020-06-15 NOTE — Telephone Encounter (Signed)
Pt left VM stating she was here Tuesday and mentioned her ear was bothering her. She was told to take zyrtec. States was told if it did not help that prednisone may be beneficial. Pt states zyrtec is not helping and would like the prednisone if possible. Please advise.  Lorita Officer, CCMA 06/15/20 11:07 AM

## 2020-06-16 NOTE — Progress Notes (Signed)
Cardiology Office Note:    Date:  06/18/2020   ID:  Sonya Francis, DOB [birth year], MRN [id number]  PCP:  Janie Morning, NP  Cardiologist:  Norman Herrlich, MD   Referring MD: Janie Morning, NP  ASSESSMENT:    1. Palpitations   2. COVID-19 long hauler    PLAN:    In order of problems listed above:  1. 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  I have asked her to utilize an echocardiogram to screen for cardiac injury uncommon in the patients not in the ICU and not requiring ventilatory support we will still have her start her low-dose beta-blocker a week apply 0 monitor for 1 week and reassess back in the office in about 6 weeks.  In the interim should be seen by pulmonary.  Prior to leaving the office will check sitting and standing heart rates and document in the chart for pots-like syndrome associated with COVID-19 infection and persistent symptoms.  Next appointment 6 weeks  Medication Adjustments/Labs and Tests Ordered: Current medicines are reviewed at length with the patient today.  Concerns regarding medicines are outlined above.  Orders Placed This Encounter  Procedures  . LONG TERM MONITOR (3-14 DAYS)  . EKG 12-Lead  . ECHOCARDIOGRAM COMPLETE   No orders of the defined types were placed in this encounter.    Chief complaint, I remain on oxygen after Covid and my heart rate is rapid at home in the range of 100 to 225 bpm.  History of Present Illness:    Sonya Francis is a 55 y.o. female who is being seen today for the evaluation of palpitation at the request of Janie Morning, NP.  She was admitted to [facility] in January nonvaccinated with COVID-19 pneumonia and hypoxemia.  Her EKG showed sinus rhythm and was normal.  She was admitted treated with IV dexamethasone remdesivir and required supplemental oxygen  while in hospital.  She steadily improved in hospital was discharged home on ambulatory oxygen.  At  discharge hemoglobin was 14.2 hematocrit 42 platelets 620,000 potassium 4.0 creatinine 0.60 GFR greater than 60 cc as expected chest x-ray showed patchy pulmonary infiltrates consistent with multifocal pneumonia her TSH is elevated but on repeat 1 week ago was normal as are to free T3 and T4.  Unfortunate since Covid pneumonia she has struggled she received a course of antibiotics a few weeks after leaving the hospital and is yet to see a pulmonary physician. She is on oxygen 2 to 4 L/day with sats in the high 90s. She is short of breath and even needs to stop and rest with ADLs but has no orthopnea or edema.  She has no cough wheezing sputum production or hemoptysis. She had no underlying lung disease. She had a history of murmur as a child she took endocarditis prophylaxis but has no known heart disease congenital or rheumatic. Her heart runs in the range of 100 225 bpm at home on a sat monitor. She is prescribed a beta-blocker but did not start. She has palpitation but fortunately has not had syncope. She did not have significant change in heart rate which shifting posture in the office today. Past Medical History:  Diagnosis Date  . Elevated TSH   . Tendonitis of ankle or foot 08/13/2015    Past Surgical History:  Procedure Laterality Date  . NONE  Current Medications: Current Meds  Medication Sig  . Ascorbic Acid (VITAMIN C) 1000 MG tablet Take 1,000 mg by mouth daily.  . fluticasone (FLONASE) 50 MCG/ACT nasal spray Place 1 spray into both nostrils daily.  Marland Kitchen loratadine (CLARITIN) 10 MG tablet TAKE 1 TABLET BY MOUTH EVERY DAY  . predniSONE (DELTASONE) 50 MG tablet Take one tablet by mouth once daily for five days  . torsemide (DEMADEX) 5 MG tablet Take 1 tablet (5 mg total) by mouth daily.  . Vitamin D, Cholecalciferol, 25 MCG (1000 UT) CAPS Take 1 capsule by mouth daily.  .  Vitamin D, Ergocalciferol, (DRISDOL) 1.25 MG (50000 UNIT) CAPS capsule Take 1 capsule (50,000 Units total) by mouth every 7 (seven) days.  . Zinc Sulfate (ZINC-220 PO) Take by mouth.     Allergies:   Patient has no known allergies.   Social History   Socioeconomic History  . Marital status: Married    Spouse name: Not on file  . Number of children: Not on file  . Years of education: Not on file  . Highest education level: Not on file  Occupational History  . Not on file  Tobacco Use  . Smoking status: Never Smoker  . Smokeless tobacco: Never Used  Vaping Use  . Vaping Use: Never used  Substance and Sexual Activity  . Alcohol use: Never  . Drug use: Never  . Sexual activity: Not on file  Other Topics Concern  . Not on file  Social History Narrative  . Not on file   Social Determinants of Health   Financial Resource Strain: Not on file  Food Insecurity: Not on file  Transportation Needs: Not on file  Physical Activity: Not on file  Stress: Not on file  Social Connections: Not on file     Family History: The patient's family history includes Cancer in her father and mother.  ROS:   ROS Please see the history of present illness.     All other systems reviewed and are negative.  EKGs/Labs/Other Studies Reviewed:    The following studies were reviewed today:   EKG:  EKG is  ordered today.  The ekg ordered today is personally reviewed and demonstrates sinus tachycardia 101 bpm otherwise normal EKG  Recent Labs: 05/11/2020: ALT 25; BUN 11; Creatinine, Ser 0.74; Hemoglobin 13.4; Platelets 247; Potassium 4.1; Sodium 140 06/12/2020: TSH 4.110  Recent Lipid Panel    Component Value Date/Time   CHOL 190 05/11/2020 0808   TRIG 114 05/11/2020 0808   HDL 46 05/11/2020 0808   CHOLHDL 4.1 05/11/2020 0808   LDLCALC 123 (H) 05/11/2020 0808    Physical Exam:    VS:  BP (!) 148/82   Pulse (!) 101   Ht 5\' 5"  (1.651 m)   Wt 209 lb 3.2 oz (94.9 kg)   SpO2 98% Comment: 2  liters [place]  BMI 34.81 kg/m     Wt Readings from Last 3 Encounters:  06/18/20 209 lb 3.2 oz (94.9 kg)  06/12/20 204 lb (92.5 kg)  05/10/20 188 lb (85.3 kg)     GEN: Does not look chronically ill or sick well nourished, well developed in no acute distress HEENT: Normal NECK: No JVD; No carotid bruits LYMPHATICS: No lymphadenopathy CARDIAC: RRR, no murmurs, rubs, gallops RESPIRATORY:  Clear to auscultation without rales, wheezing or rhonchi  ABDOMEN: Soft, non-tender, non-distended MUSCULOSKELETAL:  No edema; No deformity  SKIN: Warm and dry NEUROLOGIC:  Alert and oriented x 3 PSYCHIATRIC:  Normal affect  Signed, Norman Herrlich, MD  06/18/2020 9:11 AM    [facility]

## 2020-06-18 ENCOUNTER — Encounter: Payer: Self-pay | Admitting: Cardiology

## 2020-06-18 ENCOUNTER — Ambulatory Visit (INDEPENDENT_AMBULATORY_CARE_PROVIDER_SITE_OTHER): Payer: Managed Care, Other (non HMO) | Admitting: Cardiology

## 2020-06-18 ENCOUNTER — Other Ambulatory Visit: Payer: Self-pay

## 2020-06-18 ENCOUNTER — Ambulatory Visit (INDEPENDENT_AMBULATORY_CARE_PROVIDER_SITE_OTHER): Payer: Managed Care, Other (non HMO)

## 2020-06-18 VITALS — BP 148/82 | HR 101 | Ht 65.0 in | Wt 209.2 lb

## 2020-06-18 DIAGNOSIS — U099 Post covid-19 condition, unspecified: Secondary | ICD-10-CM | POA: Insufficient documentation

## 2020-06-18 DIAGNOSIS — R002 Palpitations: Secondary | ICD-10-CM

## 2020-06-18 HISTORY — DX: Post covid-19 condition, unspecified: U09.9

## 2020-06-18 NOTE — Patient Instructions (Signed)
Medication Instructions:  Your physician recommends that you continue on your current medications as directed. Please refer to the Current Medication list given to you today.  Please go ahead and start your TOPROL XL *If you need a refill on your cardiac medications before your next appointment, please call your pharmacy*   Lab Work: None If you have labs (blood work) drawn today and your tests are completely normal, you will receive your results only by: Marland Kitchen MyChart Message (if you have MyChart) OR . A paper copy in the mail If you have any lab test that is abnormal or we need to change your treatment, we will call you to review the results.   Testing/Procedures: Your physician has requested that you have an echocardiogram. Echocardiography is a painless test that uses sound waves to create images of your heart. It provides your doctor with information about the size and shape of your heart and how well your heart's chambers and valves are working. This procedure takes approximately one hour. There are no restrictions for this procedure.  A zio monitor was ordered today. It will remain on for 7 days. You will then return monitor and event diary in provided box. It takes 1-2 weeks for report to be downloaded and returned to [place]. We will call you with the results. If monitor falls off or has orange flashing light, please call Zio for further instructions.      Follow-Up: At [facility], you and your health needs are our priority.  As part of our continuing mission to provide you with exceptional heart care, we have created designated Provider Care Teams.  These Care Teams include your primary Cardiologist (physician) and Advanced Practice Providers (APPs -  Physician Assistants and Nurse Practitioners) who all work together to provide you with the care you need, when you need it.  We recommend signing up for the patient portal called "MyChart".  Sign up information is provided on this After  Visit Summary.  MyChart is used to connect with patients for Virtual Visits (Telemedicine).  Patients are able to view lab/test results, encounter notes, upcoming appointments, etc.  Non-urgent messages can be sent to your provider as well.   To learn more about what you can do with MyChart, go to [email or web address].    Your next appointment:   6 week(s)  The format for your next appointment:   In Person  Provider:   Norman Herrlich, MD   Other Instructions

## 2020-06-19 ENCOUNTER — Ambulatory Visit (INDEPENDENT_AMBULATORY_CARE_PROVIDER_SITE_OTHER): Payer: Managed Care, Other (non HMO)

## 2020-06-19 DIAGNOSIS — R002 Palpitations: Secondary | ICD-10-CM | POA: Diagnosis not present

## 2020-06-19 LAB — ECHOCARDIOGRAM COMPLETE
Area-P 1/2: 4.24 cm2
S' Lateral: 3.4 cm

## 2020-06-19 NOTE — Progress Notes (Signed)
Complete echocardiogram performed.  Jimmy Jathen Sudano RDCS, RVT  

## 2020-06-24 DIAGNOSIS — R002 Palpitations: Secondary | ICD-10-CM | POA: Diagnosis not present

## 2020-07-10 ENCOUNTER — Other Ambulatory Visit: Payer: Self-pay

## 2020-07-10 ENCOUNTER — Ambulatory Visit (INDEPENDENT_AMBULATORY_CARE_PROVIDER_SITE_OTHER): Payer: Managed Care, Other (non HMO) | Admitting: Nurse Practitioner

## 2020-07-10 ENCOUNTER — Encounter: Payer: Self-pay | Admitting: Nurse Practitioner

## 2020-07-10 VITALS — BP 124/66 | HR 96 | Temp 97.2°F | Resp 20 | Ht 65.0 in | Wt 213.0 lb

## 2020-07-10 DIAGNOSIS — R5382 Chronic fatigue, unspecified: Secondary | ICD-10-CM

## 2020-07-10 DIAGNOSIS — M545 Low back pain, unspecified: Secondary | ICD-10-CM

## 2020-07-10 DIAGNOSIS — U099 Post covid-19 condition, unspecified: Secondary | ICD-10-CM | POA: Diagnosis not present

## 2020-07-10 MED ORDER — MELOXICAM 7.5 MG PO TABS: 7.5000 mg | ORAL_TABLET | Freq: Every day | ORAL | 0 refills | Status: DC

## 2020-07-10 NOTE — Patient Instructions (Addendum)
Come by to pick up work [employer] papers tomorrow Begin Mobic 7.5 mg by mouth with food daily Keep all appointments with cardiology and pulmonary Follow-up in 27-months fasting  Arthritis Arthritis means joint pain. It can also mean joint disease. A joint is a place where bones come together. There are more than 100 types of arthritis. What are the causes? This condition may be caused by:  Wear and tear of a joint. This is the most common cause.  A lot of acid in the blood, which leads to pain in the joint (gout).  Pain and swelling (inflammation) in a joint.  Infection of a joint.  Injuries in the joint.  A reaction to medicines (allergy). In some cases, the cause may not be known. What are the signs or symptoms? Symptoms of this condition include:  Redness at a joint.  Swelling at a joint.  Stiffness at a joint.  Warmth coming from the joint.  A fever.  A feeling of being sick. How is this treated? This condition may be treated with:  Treating the cause, if it is known.  Rest.  Raising (elevating) the joint.  Putting cold or hot packs on the joint.  Medicines to treat symptoms and reduce pain and swelling.  Shots of medicines (cortisone) into the joint. You may also be told to make changes in your life, such as doing exercises and losing weight. Follow these instructions at home: Medicines  Take over-the-counter and prescription medicines only as told by your doctor.  Do not take aspirin for pain if your doctor says that you may have gout. Activity  Rest your joint if your doctor tells you to.  Avoid activities that make the pain worse.  Exercise your joint regularly as told by your doctor. Try doing exercises like: ? Swimming. ? Water aerobics. ? Biking. ? Walking. Managing pain, stiffness, and swelling  If told, put ice on the affected area. ? Put ice in a plastic bag. ? Place a towel between your skin and the bag. ? Leave the ice on for 20  minutes, 2-3 times per day.  If your joint is swollen, raise (elevate) it above the level of your heart if told by your doctor.  If your joint feels stiff in the morning, try taking a warm shower.  If told, put heat on the affected area. Do this as often as told by your doctor. Use the heat source that your doctor recommends, such as a moist heat pack or a heating pad. If you have diabetes, do not apply heat without asking your doctor. To apply heat: ? Place a towel between your skin and the heat source. ? Leave the heat on for 20-30 minutes. ? Remove the heat if your skin turns bright red. This is very important if you are unable to feel pain, heat, or cold. You may have a greater risk of getting burned.      General instructions  Do not use any products that contain nicotine or tobacco, such as cigarettes, e-cigarettes, and chewing tobacco. If you need help quitting, ask your doctor.  Keep all follow-up visits as told by your doctor. This is important. Contact a doctor if:  The pain gets worse.  You have a fever. Get help right away if:  You have very bad pain in your joint.  You have swelling in your joint.  Your joint is red.  Many joints become painful and swollen.  You have very bad back pain.  Your  leg is very weak.  You cannot control your pee (urine) or poop (stool). Summary  Arthritis means joint pain. It can also mean joint disease. A joint is a place where bones come together.  The most common cause of this condition is wear and tear of a joint.  Symptoms of this condition include redness, swelling, or stiffness of the joint.  This condition is treated with rest, raising the joint, medicines, and putting cold or hot packs on the joint.  Follow your doctor's instructions about medicines, activity, exercises, and other home care treatments. This information is not intended to replace advice given to you by your health care provider. Make sure you discuss any  questions you have with your health care provider. Document Revised: 03/01/2018 Document Reviewed: 03/01/2018 Elsevier Patient Education  2021 [employer].

## 2020-07-10 NOTE — Progress Notes (Signed)
Established Patient Office Visit  Subjective:  Patient ID: Sonya Francis, female    DOB: 03/19/1966  Age: 55 y.o. 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 She was diagnosed with COVID-19 04/09/20, was hospitalized from 04/15/20-04/25/2020. She is currently being followed by Dr Dulce Sellar, cardiology, and Dr Blenda Nicely, pulmonology. She continues to have chronic hypoxia with respiratory failure. She remains on O2 at 2L/min via [place] continuously. She states she feels stronger each day. Fatigue has improved. She is able to perform ADLs more easily without experiencing dyspnea. She tells me she is experiencing lower back pain and generalized joint pain. She speculates back pain may be from carrying O2 cannister with her constantly. She denies treatment for pain.   Past Medical History:  Diagnosis Date  . Elevated TSH   . Tendonitis of ankle or foot 08/13/2015    Past Surgical History:  Procedure Laterality Date  . NONE      Family History  Problem Relation Age of Onset  . Cancer Mother   . Cancer Father     Social History   Socioeconomic History  . Marital status: Married    Spouse name: Not on file  . Number of children: Not on file  . Years of education: Not on file  . Highest education level: Not on file  Occupational History  . Not on file  Tobacco Use  . Smoking status: Never Smoker  . Smokeless tobacco: Never Used  Vaping Use  . Vaping Use: Never used  Substance and Sexual Activity  . Alcohol use: Never  . Drug use: Never  . Sexual activity: Not on file  Other Topics Concern  . Not on file  Social History Narrative  . Not on file   Social Determinants of Health   Financial Resource Strain: Not on file  Food Insecurity: Not on file  Transportation Needs: Not on file  Physical Activity: Not on file  Stress: Not on file  Social Connections: Not on file  Intimate Partner Violence: Not on file     Outpatient Medications Prior to Visit  Medication Sig Dispense Refill  . Ascorbic Acid (VITAMIN C) 1000 MG tablet Take 1,000 mg by mouth daily.    . fluticasone (FLONASE) 50 MCG/ACT nasal spray Place 1 spray into both nostrils daily.    Marland Kitchen loratadine (CLARITIN) 10 MG tablet TAKE 1 TABLET BY MOUTH EVERY DAY 90 tablet 0  . metoprolol succinate (TOPROL-XL) 25 MG 24 hr tablet Take 1 tablet (25 mg total) by mouth daily. (Patient not taking: Reported on 06/18/2020) 90 tablet 0  . predniSONE (DELTASONE) 50 MG tablet Take one tablet by mouth once daily for five days 5 tablet 0  . torsemide (DEMADEX) 5 MG tablet Take 1 tablet (5 mg total) by mouth daily. 90 tablet 0  . Vitamin D, Cholecalciferol, 25 MCG (1000 UT) CAPS Take 1 capsule by mouth daily.    . Vitamin D, Ergocalciferol, (DRISDOL) 1.25 MG (50000 UNIT) CAPS capsule Take 1 capsule (50,000 Units total) by mouth every 7 (seven) days. 5 capsule 2  . Zinc Sulfate (ZINC-220 PO) Take by mouth.     No facility-administered medications prior to visit.    No Known Allergies  ROS Review of Systems  Constitutional: Positive for fatigue. Negative for appetite change and unexpected weight change.  HENT: Negative for congestion, ear pain, rhinorrhea, sinus pressure, sinus pain and tinnitus.   Eyes: Negative for pain.  Respiratory:  Positive for cough and shortness of breath.   Cardiovascular: Negative for chest pain, palpitations and leg swelling.  Gastrointestinal: Negative for abdominal pain, constipation, diarrhea, nausea and vomiting.  Endocrine: Negative for cold intolerance, heat intolerance, polydipsia, polyphagia and polyuria.  Genitourinary: Negative for dysuria, frequency and hematuria.  Musculoskeletal: Positive for arthralgias (generalized joint pain) and back pain. Negative for joint swelling and myalgias.  Skin: Negative for rash.  Allergic/Immunologic: Negative for environmental allergies.  Neurological: Negative for dizziness and  headaches.  Hematological: Negative for adenopathy.  Psychiatric/Behavioral: Negative for decreased concentration and sleep disturbance. The patient is not nervous/anxious.       Objective:    Physical Exam Vitals reviewed.  Constitutional:      Appearance: Normal appearance.  HENT:     Head: Normocephalic.     Right Ear: Tympanic membrane normal.     Left Ear: Tympanic membrane normal.     Nose: Nose normal.     Mouth/Throat:     Mouth: Mucous membranes are moist.  Neck:     Vascular: No carotid bruit.  Cardiovascular:     Rate and Rhythm: Normal rate and regular rhythm.     Pulses: Normal pulses.     Heart sounds: Normal heart sounds.  Pulmonary:     Effort: Pulmonary effort is normal.     Comments: Absent breath sounds in posterior lower lobes Abdominal:     General: Bowel sounds are normal.     Palpations: Abdomen is soft.     Tenderness: There is no abdominal tenderness. There is no guarding.  Musculoskeletal:        General: Tenderness (lower back) present. No swelling.  Skin:    General: Skin is warm and dry.     Capillary Refill: Capillary refill takes less than 2 seconds.  Neurological:     General: No focal deficit present.     Mental Status: She is alert and oriented to person, place, and time.  Psychiatric:        Mood and Affect: Mood normal.        Behavior: Behavior normal.     BP 124/66   Pulse 96   Temp (!) 97.2 F (36.2 C)   Resp 20   Ht 5\' 5"  (1.651 m)   Wt 213 lb (96.6 kg)   SpO2 100% Comment: O2 @ 2/L min.  BMI 35.45 kg/m  Wt Readings from Last 3 Encounters:  07/10/20 213 lb (96.6 kg)  06/18/20 209 lb 3.2 oz (94.9 kg)  06/12/20 204 lb (92.5 kg)     Health Maintenance Due  Topic Date Due  . Hepatitis C Screening  Never done  . COVID-19 Vaccine (1) Never done  . HIV Screening  Never done  . TETANUS/TDAP  Never done  . PAP SMEAR-Modifier  Never done  . COLONOSCOPY (Pts 45-64yrs Insurance coverage will need to be confirmed)  Never  done  . MAMMOGRAM  Never done      Lab Results  Component Value Date   TSH 4.110 06/12/2020   Lab Results  Component Value Date   WBC 6.8 05/11/2020   HGB 13.4 05/11/2020   HCT 41.7 05/11/2020   MCV 91 05/11/2020   PLT 247 05/11/2020   Lab Results  Component Value Date   NA 140 05/11/2020   K 4.1 05/11/2020   CO2 20 05/11/2020   GLUCOSE 108 (H) 05/11/2020   BUN 11 05/11/2020   CREATININE 0.74 05/11/2020   BILITOT 0.5 05/11/2020  ALKPHOS 105 05/11/2020   AST 16 05/11/2020   ALT 25 05/11/2020   PROT 7.0 05/11/2020   ALBUMIN 4.1 05/11/2020   CALCIUM 9.3 05/11/2020   Lab Results  Component Value Date   CHOL 190 05/11/2020   Lab Results  Component Value Date   HDL 46 05/11/2020   Lab Results  Component Value Date   LDLCALC 123 (H) 05/11/2020   Lab Results  Component Value Date   TRIG 114 05/11/2020   Lab Results  Component Value Date   CHOLHDL 4.1 05/11/2020   No results found for: HGBA1C    Assessment & Plan:    1. COVID-19 long hauler manifesting chronic fatigue  2. Post-COVID syndrome  3. Bilateral low back pain without sciatica, unspecified chronicity - meloxicam (MOBIC) 7.5 MG tablet; Take 1 tablet (7.5 mg total) by mouth daily.  Dispense: 30 tablet; Refill: 0   Come by to pick up work [employer] papers tomorrow Begin Mobic 7.5 mg by mouth with food daily Keep all appointments with cardiology and pulmonary Follow-up in 59-months fasting      Follow-up: 58-months    Janie Morning, NP

## 2020-07-11 ENCOUNTER — Encounter: Payer: Self-pay | Admitting: Nurse Practitioner

## 2020-07-16 ENCOUNTER — Telehealth: Payer: Self-pay

## 2020-07-16 NOTE — Telephone Encounter (Signed)
-----   Message from Baldo Daub, MD sent at 07/14/2020  3:51 PM EDT ----- Like the echocardiogram another good result there is no arrhythmia of concern present.  Not infrequently after COVID-19 people are quite cardiac aware.  This improves with time.

## 2020-07-16 NOTE — Telephone Encounter (Signed)
Spoke with patient regarding results and recommendation.  Patient verbalizes understanding and is agreeable to plan of care. Advised patient to call back with any issues or concerns.  

## 2020-07-24 ENCOUNTER — Encounter: Payer: Self-pay | Admitting: Cardiology

## 2020-07-24 ENCOUNTER — Other Ambulatory Visit: Payer: Self-pay

## 2020-07-24 ENCOUNTER — Ambulatory Visit (INDEPENDENT_AMBULATORY_CARE_PROVIDER_SITE_OTHER): Payer: Managed Care, Other (non HMO) | Admitting: Cardiology

## 2020-07-24 VITALS — BP 134/80 | HR 80 | Ht 65.0 in | Wt 215.2 lb

## 2020-07-24 DIAGNOSIS — R002 Palpitations: Secondary | ICD-10-CM

## 2020-07-24 DIAGNOSIS — U099 Post covid-19 condition, unspecified: Secondary | ICD-10-CM

## 2020-07-24 NOTE — Patient Instructions (Signed)

## 2020-07-24 NOTE — Progress Notes (Signed)
Cardiology Office Note:    Date:  07/24/2020   ID:  Sonya Francis, DOB 1965-06-23, MRN [id number]  PCP:  Janie Morning, NP  Cardiologist:  Norman Herrlich, MD    Referring MD: Janie Morning, NP    ASSESSMENT:    1. Palpitations   2. COVID-19 long hauler    PLAN:    In order of problems listed above:  1. Unfortunately her symptoms are not due to arrhythmia or cardiac dysfunction commonly post-COVID people have palpitation due to autonomic dysfunction low-dose beta-blocker has helped her no bronchospasm continue the same and will continue to follow with her pulmonary doctor for chronic respiratory failure   Next appointment: As needed   Medication Adjustments/Labs and Tests Ordered: Current medicines are reviewed at length with the patient today.  Concerns regarding medicines are outlined above.  No orders of the defined types were placed in this encounter.  No orders of the defined types were placed in this encounter.   No chief complaint on file.   History of Present Illness:    Sonya Francis is a 55 y.o. female with a hx of COVID-19 pneumonia with hypoxemia January 2021 admitted to the hospital treated with dexamethasone remdesivir and required supplemental oxygen while in hospital.  She was last seen 06/18/2020 for palpitation.  Compliance with diet, lifestyle and medications: Yes  I do not have records but she tells me that her chest x-ray is abnormal and was told by pulmonology to pursue disability. She is discouraged she is short of breath with more than minor activities and remains on oxygen. Her beta-blocker is help with her tachycardia is no longer bothersome to her She has no wheezing and presently is on antibiotics. No edema orthopnea chest pain or syncope  Echocardiogram 06/19/2020 showing normal left ventricular function filling pressures normal right ventricular size and function and no valvular abnormality. 7-day event monitor showed rare ventricular  and supraventricular ectopy average heart rate of 79 bpm. Past Medical History:  Diagnosis Date  . Elevated TSH   . Tendonitis of ankle or foot 08/13/2015    Past Surgical History:  Procedure Laterality Date  . NONE      Current Medications: Current Meds  Medication Sig  . albuterol (VENTOLIN HFA) 108 (90 Base) MCG/ACT inhaler Inhale 2 puffs into the lungs every 4 (four) hours as needed for wheezing.  . Ascorbic Acid (VITAMIN C) 1000 MG tablet Take 1,000 mg by mouth daily.  . fluticasone (FLONASE) 50 MCG/ACT nasal spray Place 1 spray into both nostrils daily.  Marland Kitchen loratadine (CLARITIN) 10 MG tablet TAKE 1 TABLET BY MOUTH EVERY DAY  . meloxicam (MOBIC) 7.5 MG tablet Take 7.5 mg by mouth daily.  . metoprolol succinate (TOPROL-XL) 25 MG 24 hr tablet Take 1 tablet (25 mg total) by mouth daily.  Marland Kitchen torsemide (DEMADEX) 5 MG tablet Take 1 tablet (5 mg total) by mouth daily.  . Vitamin D, Cholecalciferol, 25 MCG (1000 UT) CAPS Take 1 capsule by mouth daily.  . Vitamin D, Ergocalciferol, (DRISDOL) 1.25 MG (50000 UNIT) CAPS capsule Take 1 capsule (50,000 Units total) by mouth every 7 (seven) days.     Allergies:   Patient has no known allergies.   Social History   Socioeconomic History  . Marital status: Married    Spouse name: Not on file  . Number of children: Not on file  . Years of education: Not on file  . Highest education level: Not on file  Occupational History  .  Not on file  Tobacco Use  . Smoking status: Never Smoker  . Smokeless tobacco: Never Used  Vaping Use  . Vaping Use: Never used  Substance and Sexual Activity  . Alcohol use: Never  . Drug use: Never  . Sexual activity: Not on file  Other Topics Concern  . Not on file  Social History Narrative  . Not on file   Social Determinants of Health   Financial Resource Strain: Not on file  Food Insecurity: Not on file  Transportation Needs: Not on file  Physical Activity: Not on file  Stress: Not on file  Social  Connections: Not on file     Family History: The patient's family history includes Cancer in her father and mother. ROS:   Please see the history of present illness.    All other systems reviewed and are negative.  EKGs/Labs/Other Studies Reviewed:    The following studies were reviewed today:    Recent Labs: 05/11/2020: ALT 25; BUN 11; Creatinine, Ser 0.74; Hemoglobin 13.4; Platelets 247; Potassium 4.1; Sodium 140 06/12/2020: TSH 4.110  Recent Lipid Panel    Component Value Date/Time   CHOL 190 05/11/2020 0808   TRIG 114 05/11/2020 0808   HDL 46 05/11/2020 0808   CHOLHDL 4.1 05/11/2020 0808   LDLCALC 123 (H) 05/11/2020 0808    Physical Exam:    VS:  BP 134/80 (BP Location: Left Arm, Patient Position: Sitting)   Pulse 80   Ht 5\' 5"  (1.651 m)   Wt 215 lb 3.2 oz (97.6 kg)   SpO2 98%   BMI 35.81 kg/m     Wt Readings from Last 3 Encounters:  07/24/20 215 lb 3.2 oz (97.6 kg)  07/10/20 213 lb (96.6 kg)  06/18/20 209 lb 3.2 oz (94.9 kg)     GEN:  Well nourished, well developed in no acute distress HEENT: Normal NECK: No JVD; No carotid bruits LYMPHATICS: No lymphadenopathy CARDIAC: RRR, no murmurs, rubs, gallops RESPIRATORY:  Clear to auscultation without rales, wheezing or rhonchi  ABDOMEN: Soft, non-tender, non-distended MUSCULOSKELETAL:  No edema; No deformity  SKIN: Warm and dry NEUROLOGIC:  Alert and oriented x 3 PSYCHIATRIC:  Normal affect    Signed, 06/20/20, MD  07/24/2020 1:16 PM    [facility]

## 2020-08-07 ENCOUNTER — Other Ambulatory Visit: Payer: Self-pay | Admitting: Nurse Practitioner

## 2020-08-07 DIAGNOSIS — M158 Other polyosteoarthritis: Secondary | ICD-10-CM

## 2020-08-19 ENCOUNTER — Other Ambulatory Visit: Payer: Self-pay | Admitting: Nurse Practitioner

## 2020-08-19 DIAGNOSIS — E559 Vitamin D deficiency, unspecified: Secondary | ICD-10-CM

## 2020-08-29 ENCOUNTER — Other Ambulatory Visit: Payer: Self-pay | Admitting: Nurse Practitioner

## 2020-09-05 ENCOUNTER — Telehealth: Payer: Self-pay | Admitting: Cardiology

## 2020-09-05 NOTE — Telephone Encounter (Signed)
    Monica with [facility] requesting copy of result of pt's echo and ekg. She gave fax# [phone] , ATTN: [facility]

## 2020-09-05 NOTE — Telephone Encounter (Signed)
Fax sent at this time.  

## 2020-09-15 ENCOUNTER — Other Ambulatory Visit: Payer: Self-pay | Admitting: Nurse Practitioner

## 2020-09-15 DIAGNOSIS — R000 Tachycardia, unspecified: Secondary | ICD-10-CM

## 2020-10-16 ENCOUNTER — Ambulatory Visit: Payer: Managed Care, Other (non HMO) | Admitting: Nurse Practitioner

## 2020-10-31 ENCOUNTER — Other Ambulatory Visit: Payer: Self-pay | Admitting: Nurse Practitioner

## 2020-10-31 DIAGNOSIS — M158 Other polyosteoarthritis: Secondary | ICD-10-CM

## 2020-11-04 ENCOUNTER — Other Ambulatory Visit: Payer: Self-pay | Admitting: Nurse Practitioner

## 2020-11-04 DIAGNOSIS — E559 Vitamin D deficiency, unspecified: Secondary | ICD-10-CM

## 2020-12-04 ENCOUNTER — Other Ambulatory Visit: Payer: Self-pay | Admitting: Nurse Practitioner

## 2020-12-04 DIAGNOSIS — R000 Tachycardia, unspecified: Secondary | ICD-10-CM

## 2021-02-08 ENCOUNTER — Other Ambulatory Visit: Payer: Self-pay | Admitting: Nurse Practitioner

## 2021-02-08 DIAGNOSIS — M158 Other polyosteoarthritis: Secondary | ICD-10-CM

## 2021-02-19 ENCOUNTER — Other Ambulatory Visit: Payer: Self-pay | Admitting: Nurse Practitioner

## 2021-02-26 ENCOUNTER — Other Ambulatory Visit: Payer: Self-pay | Admitting: Nurse Practitioner

## 2021-02-26 DIAGNOSIS — R000 Tachycardia, unspecified: Secondary | ICD-10-CM

## 2021-03-25 ENCOUNTER — Ambulatory Visit (INDEPENDENT_AMBULATORY_CARE_PROVIDER_SITE_OTHER): Payer: 59 | Admitting: Physician Assistant

## 2021-03-25 ENCOUNTER — Ambulatory Visit: Payer: Managed Care, Other (non HMO) | Admitting: Nurse Practitioner

## 2021-03-25 ENCOUNTER — Encounter: Payer: Self-pay | Admitting: Physician Assistant

## 2021-03-25 ENCOUNTER — Other Ambulatory Visit: Payer: Self-pay

## 2021-03-25 VITALS — BP 132/80 | HR 99 | Temp 96.8°F | Ht 65.0 in | Wt 218.0 lb

## 2021-03-25 DIAGNOSIS — G8929 Other chronic pain: Secondary | ICD-10-CM

## 2021-03-25 DIAGNOSIS — U099 Post covid-19 condition, unspecified: Secondary | ICD-10-CM | POA: Diagnosis not present

## 2021-03-25 DIAGNOSIS — R7989 Other specified abnormal findings of blood chemistry: Secondary | ICD-10-CM

## 2021-03-25 DIAGNOSIS — M545 Low back pain, unspecified: Secondary | ICD-10-CM

## 2021-03-25 DIAGNOSIS — E559 Vitamin D deficiency, unspecified: Secondary | ICD-10-CM

## 2021-03-25 MED ORDER — MELOXICAM 15 MG PO TABS: 15.0000 mg | ORAL_TABLET | Freq: Every day | ORAL | 0 refills | Status: DC

## 2021-03-25 NOTE — Progress Notes (Signed)
Subjective:  Patient ID: Sonya Francis, female    DOB: 12/18/1965  Age: 56 y.o. 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  She has been released by Dr Dulce Sellar and says she still follows with pulmonologist Dr Blenda Nicely Pt states that she is not having any breathing issues and actually pt is hooked up to nasal cannula to portable oxygen tank but the tank actually is not even on and her oxygen on room air is 95 percent She states she uses oxygen sometimes at 2L with activity  Pt with history of vitamin D def - due for labwork  Pt states that she has had chronic midline low back pain for the past year - states sore with certain movements - was taking meloxicam 7.5mg  qd but has not been helping Requests back xray Current Outpatient Medications on File Prior to Visit  Medication Sig Dispense Refill   ADVAIR DISKUS 100-50 MCG/ACT AEPB SMARTSIG:1 Inhalation Via Inhaler Twice Daily     albuterol (VENTOLIN HFA) 108 (90 Base) MCG/ACT inhaler Inhale 2 puffs into the lungs every 4 (four) hours as needed for wheezing.     Ascorbic Acid (VITAMIN C) 1000 MG tablet Take 1,000 mg by mouth daily.     fluticasone (FLONASE) 50 MCG/ACT nasal spray Place 1 spray into both nostrils daily.     loratadine (CLARITIN) 10 MG tablet TAKE 1 TABLET BY MOUTH EVERY DAY 90 tablet 0   metoprolol succinate (TOPROL-XL) 25 MG 24 hr tablet TAKE 1 TABLET (25 MG TOTAL) BY MOUTH DAILY. 30 tablet 2   torsemide (DEMADEX) 5 MG tablet TAKE 1 TABLET (5 MG TOTAL) BY MOUTH DAILY. 30 tablet 2   Vitamin D, Cholecalciferol, 25 MCG (1000 UT) CAPS Take 1 capsule by mouth daily.     Vitamin D, Ergocalciferol, (DRISDOL) 1.25 MG (50000 UNIT) CAPS capsule TAKE 1 CAPSULE (50,000 UNITS TOTAL) BY MOUTH EVERY 7 (SEVEN) DAYS 4 capsule 3   No current facility-administered medications on file prior to visit.   Past Medical  History:  Diagnosis Date   Elevated TSH    Tendonitis of ankle or foot 08/13/2015   Past Surgical History:  Procedure Laterality Date   NONE      Family History  Problem Relation Age of Onset   Cancer Mother    Cancer Father    Social History   Socioeconomic History   Marital status: Married    Spouse name: Not on file   Number of children: Not on file   Years of education: Not on file   Highest education level: Not on file  Occupational History   Not on file  Tobacco Use   Smoking status: Never   Smokeless tobacco: Never  Vaping Use   Vaping Use: Never used  Substance and Sexual Activity   Alcohol use: Never   Drug use: Never   Sexual activity: Not on file  Other Topics Concern   Not on file  Social History Narrative   Not on file   Social Determinants of Health   Financial Resource Strain: Not on file  Food Insecurity: Not on file  Transportation Needs: Not on file  Physical Activity: Not on file  Stress: Not on file  Social Connections: Not on file    Review of Systems CONSTITUTIONAL: Negative for chills, fatigue, fever,  unintentional weight gain and unintentional weight loss.  CARDIOVASCULAR: Negative for chest pain, dizziness, palpitations and pedal edema.  RESPIRATORY: Negative for recent cough and dyspnea.  MSK: see HPI INTEGUMENTARY: Negative for rash.  PSYCHIATRIC: Negative for sleep disturbance and to question depression screen.  Negative for depression, negative for anhedonia.       Objective:  BP 132/80 (BP Location: Left Arm, Patient Position: Sitting, Cuff Size: Large)    Pulse 99    Temp (!) 96.8 F (36 C) (Temporal)    Ht 5\' 5"  (1.651 m)    Wt 218 lb (98.9 kg)    SpO2 95%   BMI 36.28 kg/m   BP/Weight 03/25/2021 07/24/2020 07/10/2020  Systolic BP 132 134 124  Diastolic BP 80 80 66  Wt. (Lbs) 218 215.2 213  BMI 36.28 35.81 35.45    Physical Exam PHYSICAL EXAM:   VS: BP 132/80 (BP Location: Left Arm, Patient Position: Sitting, Cuff  Size: Large)    Pulse 99    Temp (!) 96.8 F (36 C) (Temporal)    Ht 5\' 5"  (1.651 m)    Wt 218 lb (98.9 kg)    SpO2 95% Comment: 2 liters of O2   BMI 36.28 kg/m   GEN: Well nourished, well developed, in no acute distress  Cardiac: RRR; no murmurs,  Respiratory:  normal respiratory rate and pattern with no distress - normal breath sounds with no rales, rhonchi, wheezes or rubs Skin: warm and dry, no rash  Psych: euthymic mood, appropriate affect and demeanor  Diabetic Foot Exam - Simple   No data filed      Lab Results  Component Value Date   WBC 6.8 05/11/2020   HGB 13.4 05/11/2020   HCT 41.7 05/11/2020   PLT 247 05/11/2020   GLUCOSE 108 (H) 05/11/2020   CHOL 190 05/11/2020   TRIG 114 05/11/2020   HDL 46 05/11/2020   LDLCALC 123 (H) 05/11/2020   ALT 25 05/11/2020   AST 16 05/11/2020   NA 140 05/11/2020   K 4.1 05/11/2020   CL 102 05/11/2020   CREATININE 0.74 05/11/2020   BUN 11 05/11/2020   CO2 20 05/11/2020   TSH 4.110 06/12/2020      Assessment & Plan:   Problem List Items Addressed This Visit       Other   Elevated TSH   Relevant Orders   TSH   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  Meds ordered this encounter  Medications   meloxicam (MOBIC) 15 MG tablet    Sig: Take 1 tablet (15 mg total) by mouth daily.    Dispense:  30 tablet    Refill:  0    Order Specific Question:   Supervising Provider    AnswerDARENE, NAPPI     Orders Placed This Encounter  Procedures  DG Lumbar Spine Complete   CBC with Differential/Platelet   Comprehensive metabolic panel   TSH   Lipid panel   VITAMIN D 25 Hydroxy (Vit-D Deficiency, Fractures)     Follow-up: Return in about 3 months (around 06/23/2021) for chronic fatsing WITH SHANNON.  An After Visit Summary was printed and given to the patient.  [facility] [phone]

## 2021-03-26 LAB — COMPREHENSIVE METABOLIC PANEL
ALT: 33 IU/L — ABNORMAL HIGH (ref 0–32)
AST: 22 IU/L (ref 0–40)
Albumin/Globulin Ratio: 1.8 (ref 1.2–2.2)
Albumin: 4.3 g/dL (ref 3.8–4.9)
Alkaline Phosphatase: 136 IU/L — ABNORMAL HIGH (ref 44–121)
BUN/Creatinine Ratio: 19 (ref 9–23)
BUN: 15 mg/dL (ref 6–24)
Bilirubin Total: 0.3 mg/dL (ref 0.0–1.2)
CO2: 23 mmol/L (ref 20–29)
Calcium: 9.4 mg/dL (ref 8.7–10.2)
Chloride: 106 mmol/L (ref 96–106)
Creatinine, Ser: 0.81 mg/dL (ref 0.57–1.00)
Globulin, Total: 2.4 g/dL (ref 1.5–4.5)
Glucose: 105 mg/dL — ABNORMAL HIGH (ref 70–99)
Potassium: 4.5 mmol/L (ref 3.5–5.2)
Sodium: 142 mmol/L (ref 134–144)
Total Protein: 6.7 g/dL (ref 6.0–8.5)
eGFR: 85 mL/min/{1.73_m2} (ref >59)

## 2021-03-26 LAB — CBC WITH DIFFERENTIAL/PLATELET
Basophils Absolute: 0 10*3/uL (ref 0.0–0.2)
Basos: 0 %
EOS (ABSOLUTE): 0.1 10*3/uL (ref 0.0–0.4)
Eos: 1 %
Hematocrit: 40.7 % (ref 34.0–46.6)
Hemoglobin: 13.2 g/dL (ref 11.1–15.9)
Immature Grans (Abs): 0 10*3/uL (ref 0.0–0.1)
Immature Granulocytes: 0 %
Lymphocytes Absolute: 2.2 10*3/uL (ref 0.7–3.1)
Lymphs: 29 %
MCH: 28.9 pg (ref 26.6–33.0)
MCHC: 32.4 g/dL (ref 31.5–35.7)
MCV: 89 fL (ref 79–97)
Monocytes Absolute: 0.6 10*3/uL (ref 0.1–0.9)
Monocytes: 7 %
Neutrophils Absolute: 4.8 10*3/uL (ref 1.4–7.0)
Neutrophils: 63 %
Platelets: 317 10*3/uL (ref 150–450)
RBC: 4.56 x10E6/uL (ref 3.77–5.28)
RDW: 13.2 % (ref 11.7–15.4)
WBC: 7.7 10*3/uL (ref 3.4–10.8)

## 2021-03-26 LAB — LIPID PANEL
Chol/HDL Ratio: 3.3 ratio (ref 0.0–4.4)
Cholesterol, Total: 156 mg/dL (ref 100–199)
HDL: 48 mg/dL (ref >39)
LDL Chol Calc (NIH): 95 mg/dL (ref 0–99)
Triglycerides: 64 mg/dL (ref 0–149)
VLDL Cholesterol Cal: 13 mg/dL (ref 5–40)

## 2021-03-26 LAB — TSH: TSH: 6.84 u[IU]/mL — ABNORMAL HIGH (ref 0.450–4.500)

## 2021-03-26 LAB — CARDIOVASCULAR RISK ASSESSMENT

## 2021-03-26 LAB — VITAMIN D 25 HYDROXY (VIT D DEFICIENCY, FRACTURES): Vit D, 25-Hydroxy: 51.6 ng/mL (ref 30.0–100.0)

## 2021-03-27 ENCOUNTER — Other Ambulatory Visit: Payer: Self-pay | Admitting: Physician Assistant

## 2021-03-27 DIAGNOSIS — R899 Unspecified abnormal finding in specimens from other organs, systems and tissues: Secondary | ICD-10-CM

## 2021-04-22 ENCOUNTER — Ambulatory Visit: Payer: Managed Care, Other (non HMO) | Admitting: Nurse Practitioner

## 2021-04-22 ENCOUNTER — Other Ambulatory Visit: Payer: Self-pay | Admitting: Physician Assistant

## 2021-04-22 DIAGNOSIS — M545 Low back pain, unspecified: Secondary | ICD-10-CM

## 2021-04-22 NOTE — Telephone Encounter (Signed)
Your pt

## 2021-04-29 ENCOUNTER — Other Ambulatory Visit: Payer: Self-pay

## 2021-04-29 ENCOUNTER — Other Ambulatory Visit: Payer: Managed Care, Other (non HMO)

## 2021-04-29 DIAGNOSIS — R899 Unspecified abnormal finding in specimens from other organs, systems and tissues: Secondary | ICD-10-CM

## 2021-04-30 LAB — THYROID PANEL WITH TSH
Free Thyroxine Index: 1.3 (ref 1.2–4.9)
T3 Uptake Ratio: 23 % — ABNORMAL LOW (ref 24–39)
T4, Total: 5.6 ug/dL (ref 4.5–12.0)
TSH: 9.92 u[IU]/mL — ABNORMAL HIGH (ref 0.450–4.500)

## 2021-05-01 ENCOUNTER — Other Ambulatory Visit: Payer: Self-pay | Admitting: Nurse Practitioner

## 2021-05-01 DIAGNOSIS — E039 Hypothyroidism, unspecified: Secondary | ICD-10-CM

## 2021-05-01 MED ORDER — LEVOTHYROXINE SODIUM 50 MCG PO TABS: 50.0000 ug | ORAL_TABLET | Freq: Every day | ORAL | 0 refills | Status: DC

## 2021-05-02 ENCOUNTER — Other Ambulatory Visit: Payer: Self-pay

## 2021-05-03 ENCOUNTER — Other Ambulatory Visit: Payer: Self-pay | Admitting: Nurse Practitioner

## 2021-05-03 DIAGNOSIS — M545 Low back pain, unspecified: Secondary | ICD-10-CM

## 2021-05-03 MED ORDER — DICLOFENAC SODIUM 1 % EX GEL: 4.0000 g | Freq: Four times a day (QID) | CUTANEOUS | 2 refills | Status: DC

## 2021-05-15 ENCOUNTER — Other Ambulatory Visit: Payer: Self-pay | Admitting: Nurse Practitioner

## 2021-05-22 ENCOUNTER — Other Ambulatory Visit: Payer: Self-pay | Admitting: Nurse Practitioner

## 2021-05-22 DIAGNOSIS — M545 Low back pain, unspecified: Secondary | ICD-10-CM

## 2021-05-22 DIAGNOSIS — R000 Tachycardia, unspecified: Secondary | ICD-10-CM

## 2021-06-24 ENCOUNTER — Other Ambulatory Visit: Payer: Self-pay

## 2021-06-24 ENCOUNTER — Ambulatory Visit: Payer: Managed Care, Other (non HMO) | Admitting: Nurse Practitioner

## 2021-06-24 ENCOUNTER — Encounter: Payer: Self-pay | Admitting: Nurse Practitioner

## 2021-06-24 VITALS — BP 126/84 | HR 81 | Temp 96.6°F | Ht 68.0 in | Wt 228.0 lb

## 2021-06-24 DIAGNOSIS — M545 Low back pain, unspecified: Secondary | ICD-10-CM

## 2021-06-24 DIAGNOSIS — Z2821 Immunization not carried out because of patient refusal: Secondary | ICD-10-CM

## 2021-06-24 DIAGNOSIS — R0609 Other forms of dyspnea: Secondary | ICD-10-CM

## 2021-06-24 DIAGNOSIS — J9611 Chronic respiratory failure with hypoxia: Secondary | ICD-10-CM

## 2021-06-24 DIAGNOSIS — Z2820 Immunization not carried out because of patient decision for unspecified reason: Secondary | ICD-10-CM

## 2021-06-24 DIAGNOSIS — Z6834 Body mass index (BMI) 34.0-34.9, adult: Secondary | ICD-10-CM | POA: Diagnosis not present

## 2021-06-24 DIAGNOSIS — U099 Post covid-19 condition, unspecified: Secondary | ICD-10-CM

## 2021-06-24 DIAGNOSIS — E039 Hypothyroidism, unspecified: Secondary | ICD-10-CM

## 2021-06-24 DIAGNOSIS — Z9981 Dependence on supplemental oxygen: Secondary | ICD-10-CM

## 2021-06-24 DIAGNOSIS — Z1231 Encounter for screening mammogram for malignant neoplasm of breast: Secondary | ICD-10-CM

## 2021-06-24 DIAGNOSIS — G8929 Other chronic pain: Secondary | ICD-10-CM

## 2021-06-24 DIAGNOSIS — Z5320 Procedure and treatment not carried out because of patient's decision for unspecified reasons: Secondary | ICD-10-CM

## 2021-06-24 NOTE — Progress Notes (Signed)
? ?Subjective:  ?Patient ID: Sonya Francis, female    DOB: [birth year]  Age: 56 y.o. MRN: [id number] ? ?Chief Complaint  ?Patient presents with  ? Hypothyroidism  ? Tachycardia  ? ? ?HPI ? Sonya Francis is a 56 year old Caucasian female that presents for follow-up of hypothyroidism. 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 States she has dyspnea with activity. She is followed by Dr Blenda Nicely, pulmonologist. States she was recently approved for SSI disability.  ? ?Hypothyroidism, follow-up: ?[name] was diagnosed with hypothyroidism per labs 03/25/21. TSH elevated 6.840. Current treatment Levothyroxine 50 mcg daily. States she is having symptoms of fatigue and weight gain. She is adherent to medication regimen and follow-up appointments.  ? ?Current Outpatient Medications on File Prior to Visit  ?Medication Sig Dispense Refill  ? albuterol (VENTOLIN HFA) 108 (90 Base) MCG/ACT inhaler Inhale 2 puffs into the lungs every 4 (four) hours as needed for wheezing.    ? diclofenac Sodium (VOLTAREN) 1 % GEL Apply 4 g topically 4 (four) times daily. 150 g 2  ? fluticasone (FLONASE) 50 MCG/ACT nasal spray Place 1 spray into both nostrils daily.    ? levothyroxine (SYNTHROID) 50 MCG tablet Take 1 tablet (50 mcg total) by mouth daily. 60 tablet 0  ? metoprolol succinate (TOPROL-XL) 25 MG 24 hr tablet TAKE 1 TABLET (25 MG TOTAL) BY MOUTH DAILY. 30 tablet 2  ? Multiple Vitamins-Minerals (ONE-A-DAY WOMENS PO) Take by mouth. With vitamin d    ? torsemide (DEMADEX) 5 MG tablet TAKE 1 TABLET (5 MG TOTAL) BY MOUTH DAILY. 30 tablet 2  ? loratadine (CLARITIN) 10 MG tablet TAKE 1 TABLET BY MOUTH EVERY DAY (Patient not taking: Reported on 06/24/2021) 90 tablet 0  ? ?No current facility-administered medications on file prior to visit.  ? ?Past Medical History:  ?Diagnosis Date  ? Elevated TSH   ? Tendonitis of ankle or foot 08/13/2015  ? ?Past Surgical History:  ?Procedure Laterality Date  ? NONE    ?   ?Family History  ?Problem Relation Age of Onset  ? Cancer Mother   ? Cancer Father   ? ?Social History  ? ?Socioeconomic History  ? Marital status: Married  ?  Spouse name: Not on file  ? Number of children: Not on file  ? Years of education: Not on file  ? Highest education level: Not on file  ?Occupational History  ? Not on file  ?Tobacco Use  ? Smoking status: Never  ? Smokeless tobacco: Never  ?Vaping Use  ? Vaping Use: Never used  ?Substance and Sexual Activity  ? Alcohol use: Never  ? Drug use: Never  ? Sexual activity: Not on file  ?Other Topics Concern  ? Not on file  ?Social History Narrative  ? Not on file  ? ?Social Determinants of Health  ? ?Financial Resource Strain: Not on file  ?Food Insecurity: Not on file  ?Transportation Needs: Not on file  ?Physical Activity: Not on file  ?Stress: Not on file  ?Social Connections: Not on file  ? ? ?Review of Systems  ?Constitutional:  Positive for fatigue and unexpected weight change (weight gaint). Negative for chills and fever.  ?HENT:  Negative for congestion, ear pain, rhinorrhea and sore throat.   ?Respiratory:  Positive for shortness of breath. Negative for cough.   ?Cardiovascular:  Negative for chest pain.  ?Gastrointestinal:  Negative for abdominal pain, constipation, diarrhea, nausea and vomiting.  ?Genitourinary:  Negative for dysuria and  urgency.  ?Musculoskeletal:  Positive for back pain (chronic). Negative for myalgias.  ?Neurological:  Negative for dizziness, weakness, light-headedness and headaches.  ?Psychiatric/Behavioral:  Negative for dysphoric mood. The patient is not nervous/anxious.   ? ? ?Objective:  ?BP 126/84   Pulse 81   Temp (!) 96.6 ?F (35.9 ?C)   Ht 5\' 8"  (1.727 m)   Wt 228 lb (103.4 kg)   SpO2 98%   BMI 34.67 kg/m?   ? ?BP/Weight 06/24/2021 03/25/2021 07/24/2020  ?Systolic BP - 132 134  ?Diastolic BP - 80 80  ?Wt. (Lbs) 228 218 215.2  ?BMI 34.67 36.28 35.81  ? ? ?Physical Exam ?Vitals reviewed.  ?Constitutional:   ?    Appearance: Normal appearance.  ?HENT:  ?   Head: Normocephalic.  ?   Right Ear: Tympanic membrane normal.  ?   Left Ear: Tympanic membrane normal.  ?   Nose: Nose normal.  ?   Mouth/Throat:  ?   Mouth: Mucous membranes are moist.  ?Eyes:  ?   Pupils: Pupils are equal, round, and reactive to light.  ?Cardiovascular:  ?   Rate and Rhythm: Normal rate and regular rhythm.  ?   Pulses: Normal pulses.  ?   Heart sounds: Normal heart sounds.  ?Pulmonary:  ?   Effort: Pulmonary effort is normal.  ?   Breath sounds: Normal breath sounds.  ?Abdominal:  ?   General: Bowel sounds are normal.  ?   Palpations: Abdomen is soft.  ?Musculoskeletal:     ?   General: Normal range of motion.  ?Skin: ?   General: Skin is warm and dry.  ?   Capillary Refill: Capillary refill takes less than 2 seconds.  ?Neurological:  ?   General: No focal deficit present.  ?   Mental Status: She is alert and oriented to person, place, and time.  ?Psychiatric:     ?   Mood and Affect: Mood normal.     ?   Behavior: Behavior normal.  ? ? ?  ? ?Lab Results  ?Component Value Date  ? WBC 7.7 03/25/2021  ? HGB 13.2 03/25/2021  ? HCT 40.7 03/25/2021  ? PLT 317 03/25/2021  ? GLUCOSE 105 (H) 03/25/2021  ? CHOL 156 03/25/2021  ? TRIG 64 03/25/2021  ? HDL 48 03/25/2021  ? LDLCALC 95 03/25/2021  ? ALT 33 (H) 03/25/2021  ? AST 22 03/25/2021  ? NA 142 03/25/2021  ? K 4.5 03/25/2021  ? CL 106 03/25/2021  ? CREATININE 0.81 03/25/2021  ? BUN 15 03/25/2021  ? CO2 23 03/25/2021  ? TSH 9.920 (H) 04/29/2021  ? ? ? ? ?Assessment & Plan:  ? ?1. Hypothyroidism (acquired) ?- TSH ? ?2. COVID-19 long hauler manifesting chronic dyspnea ?-continue follow-up with Dr Blenda Nicelyhodri as scheduled ? ?3. Chronic respiratory failure with hypoxia, on home oxygen therapy (HCC) ?-continue oxygen therapy PRN as prescribed ? ?4. BMI 34.0-34.9,adult ?- CBC with Differential/Platelet ?- Comprehensive metabolic panel ?- Lipid panel ?- TSH ? ?5. Encounter for screening mammogram for malignant neoplasm of  breast ?- MM Digital Screening; Future ? ?6. Colonoscopy refused ? ?7. Chronic midline low back pain without sciatica ?-Tylenol as directed OTC ?-back exercises daily ? ?8. Pap smear of cervix declined ? ?9. COVID-19 vaccination refused ? ?10. Vaccine refused by patient ?  ? ?Recommend eye exam ?Recommend physical therapy for chronic back pain  ?Perform back exercises daily ?Continue medications as prescribed ?We will call you with lab results and  mammogram appointment ?Follow-up in 5-months  ?  ? ?Follow-up: 95-months ? ?An After Visit Summary was printed and given to the patient. ? ?I, Janie Morning, NP, have reviewed all documentation for this visit. The documentation on 06/24/21 for the exam, diagnosis, procedures, and orders are all accurate and complete.  ? ? ?Signed, ?Janie Morning, NP ?[facility] ?[phone] ?

## 2021-06-24 NOTE — Patient Instructions (Addendum)
Recommend eye exam ?Recommend physical therapy for chronic back pain  ?Perform back exercises daily ?Continue medications as prescribed ?We will call you with lab results and mammogram appointment ?Follow-up in 6-months  ? ? ?Back Exercises ?These exercises help to make your trunk and back strong. They also help to keep the lower back flexible. Doing these exercises can help to prevent or lessen pain in your lower back. ?If you have back pain, try to do these exercises 2-3 times each day or as told by your doctor. ?As you get better, do the exercises once each day. Repeat the exercises more often as told by your doctor. ?To stop back pain from coming back, do the exercises once each day, or as told by your doctor. ?Do exercises exactly as told by your doctor. Stop right away if you feel sudden pain or your pain gets worse. ?Exercises ?Single knee to chest ?Do these steps 3-5 times in a row for each leg: ?Lie on your back on a firm bed or the floor with your legs stretched out. ?Bring one knee to your chest. ?Grab your knee or thigh with both hands and hold it in place. ?Pull on your knee until you feel a gentle stretch in your lower back or butt. ?Keep doing the stretch for 10-30 seconds. ?Slowly let go of your leg and straighten it. ?Pelvic tilt ?Do these steps 5-10 times in a row: ?Lie on your back on a firm bed or the floor with your legs stretched out. ?Bend your knees so they point up to the ceiling. Your feet should be flat on the floor. ?Tighten your lower belly (abdomen) muscles to press your lower back against the floor. This will make your tailbone point up to the ceiling instead of pointing down to your feet or the floor. ?Stay in this position for 5-10 seconds while you gently tighten your muscles and breathe evenly. ?Cat-cow ?Do these steps until your lower back bends more easily: ?Get on your hands and knees on a firm bed or the floor. Keep your hands under your shoulders, and keep your knees under  your hips. You may put padding under your knees. ?Let your head hang down toward your chest. Tighten (contract) the muscles in your belly. Point your tailbone toward the floor so your lower back becomes rounded like the back of a cat. ?Stay in this position for 5 seconds. ?Slowly lift your head. Let the muscles of your belly relax. Point your tailbone up toward the ceiling so your back forms a sagging arch like the back of a cow. ?Stay in this position for 5 seconds. ? ?Press-ups ?Do these steps 5-10 times in a row: ?Lie on your belly (face-down) on a firm bed or the floor. ?Place your hands near your head, about shoulder-width apart. ?While you keep your back relaxed and keep your hips on the floor, slowly straighten your arms to raise the top half of your body and lift your shoulders. Do not use your back muscles. You may change where you place your hands to make yourself more comfortable. ?Stay in this position for 5 seconds. Keep your back relaxed. ?Slowly return to lying flat on the floor. ? ?Bridges ?Do these steps 10 times in a row: ?Lie on your back on a firm bed or the floor. ?Bend your knees so they point up to the ceiling. Your feet should be flat on the floor. Your arms should be flat at your sides, next to your body. ?Tighten   your butt muscles and lift your butt off the floor until your waist is almost as high as your knees. If you do not feel the muscles working in your butt and the back of your thighs, slide your feet 1-2 inches (2.5-5 cm) farther away from your butt. ?Stay in this position for 3-5 seconds. ?Slowly lower your butt to the floor, and let your butt muscles relax. ?If this exercise is too easy, try doing it with your arms crossed over your chest. ?Belly crunches ?Do these steps 5-10 times in a row: ?Lie on your back on a firm bed or the floor with your legs stretched out. ?Bend your knees so they point up to the ceiling. Your feet should be flat on the floor. ?Cross your arms over your  chest. ?Tip your chin a little bit toward your chest, but do not bend your neck. ?Tighten your belly muscles and slowly raise your chest just enough to lift your shoulder blades a tiny bit off the floor. Avoid raising your body higher than that because it can put too much stress on your lower back. ?Slowly lower your chest and your head to the floor. ?Back lifts ?Do these steps 5-10 times in a row: ?Lie on your belly (face-down) with your arms at your sides, and rest your forehead on the floor. ?Tighten the muscles in your legs and your butt. ?Slowly lift your chest off the floor while you keep your hips on the floor. Keep the back of your head in line with the curve in your back. Look at the floor while you do this. ?Stay in this position for 3-5 seconds. ?Slowly lower your chest and your face to the floor. ?Contact a doctor if: ?Your back pain gets a lot worse when you do an exercise. ?Your back pain does not get better within 2 hours after you exercise. ?If you have any of these problems, stop doing the exercises. Do not do them again unless your doctor says it is okay. ?Get help right away if: ?You have sudden, very bad back pain. If this happens, stop doing the exercises. Do not do them again unless your doctor says it is okay. ?This information is not intended to replace advice given to you by your health care provider. Make sure you discuss any questions you have with your health care provider. ?Document Revised: 06/06/2020 Document Reviewed: 06/06/2020 ?Elsevier Patient Education ? 2022 [place]. ? ? ? ?Preventive Care 72-59 Years Old, Female ?Preventive care refers to lifestyle choices and visits with your health care provider that can promote health and wellness. Preventive care visits are also called wellness exams. ?What can I expect for my preventive care visit? ?Counseling ?Your health care provider may ask you questions about your: ?Medical history, including: ?Past medical problems. ?Family  medical history. ?Pregnancy history. ?Current health, including: ?Menstrual cycle. ?Method of birth control. ?Emotional well-being. ?Home life and relationship well-being. ?Sexual activity and sexual health. ?Lifestyle, including: ?Alcohol, nicotine or tobacco, and drug use. ?Access to firearms. ?Diet, exercise, and sleep habits. ?Work and work Statistician. ?Sunscreen use. ?Safety issues such as seatbelt and bike helmet use. ?Physical exam ?Your health care provider will check your: ?Height and weight. These may be used to calculate your BMI (body mass index). BMI is a measurement that tells if you are at a healthy weight. ?Waist circumference. This measures the distance around your waistline. This measurement also tells if you are at a healthy weight and may help predict your risk  of certain diseases, such as type 2 diabetes and high blood pressure. ?Heart rate and blood pressure. ?Body temperature. ?Skin for abnormal spots. ?What immunizations do I need? ?Vaccines are usually given at various ages, according to a schedule. Your health care provider will recommend vaccines for you based on your age, medical history, and lifestyle or other factors, such as travel or where you work. ?What tests do I need? ?Screening ?Your health care provider may recommend screening tests for certain conditions. This may include: ?Lipid and cholesterol levels. ?Diabetes screening. This is done by checking your blood sugar (glucose) after you have not eaten for a while (fasting). ?Pelvic exam and Pap test. ?Hepatitis B test. ?Hepatitis C test. ?HIV (human immunodeficiency virus) test. ?STI (sexually transmitted infection) testing, if you are at risk. ?Lung cancer screening. ?Colorectal cancer screening. ?Mammogram. Talk with your health care provider about when you should start having regular mammograms. This may depend on whether you have a family history of breast cancer. ?BRCA-related cancer screening. This may be done if you have  a family history of breast, ovarian, tubal, or peritoneal cancers. ?Bone density scan. This is done to screen for osteoporosis. ?Talk with your health care provider about your test results, treatment options, a

## 2021-06-25 LAB — COMPREHENSIVE METABOLIC PANEL
ALT: 28 IU/L (ref 0–32)
AST: 23 IU/L (ref 0–40)
Albumin/Globulin Ratio: 2.1 (ref 1.2–2.2)
Albumin: 4.4 g/dL (ref 3.8–4.9)
Alkaline Phosphatase: 130 IU/L — ABNORMAL HIGH (ref 44–121)
BUN/Creatinine Ratio: 15 (ref 9–23)
BUN: 13 mg/dL (ref 6–24)
Bilirubin Total: 0.3 mg/dL (ref 0.0–1.2)
CO2: 23 mmol/L (ref 20–29)
Calcium: 9.6 mg/dL (ref 8.7–10.2)
Chloride: 105 mmol/L (ref 96–106)
Creatinine, Ser: 0.85 mg/dL (ref 0.57–1.00)
Globulin, Total: 2.1 g/dL (ref 1.5–4.5)
Glucose: 105 mg/dL — ABNORMAL HIGH (ref 70–99)
Potassium: 4.7 mmol/L (ref 3.5–5.2)
Sodium: 141 mmol/L (ref 134–144)
Total Protein: 6.5 g/dL (ref 6.0–8.5)
eGFR: 79 mL/min/{1.73_m2} (ref >59)

## 2021-06-25 LAB — LIPID PANEL
Chol/HDL Ratio: 3.1 ratio (ref 0.0–4.4)
Cholesterol, Total: 153 mg/dL (ref 100–199)
HDL: 49 mg/dL (ref >39)
LDL Chol Calc (NIH): 89 mg/dL (ref 0–99)
Triglycerides: 75 mg/dL (ref 0–149)
VLDL Cholesterol Cal: 15 mg/dL (ref 5–40)

## 2021-06-25 LAB — CBC WITH DIFFERENTIAL/PLATELET
Basophils Absolute: 0 10*3/uL (ref 0.0–0.2)
Basos: 0 %
EOS (ABSOLUTE): 0.1 10*3/uL (ref 0.0–0.4)
Eos: 1 %
Hematocrit: 41.1 % (ref 34.0–46.6)
Hemoglobin: 13.9 g/dL (ref 11.1–15.9)
Immature Grans (Abs): 0 10*3/uL (ref 0.0–0.1)
Immature Granulocytes: 0 %
Lymphocytes Absolute: 2.6 10*3/uL (ref 0.7–3.1)
Lymphs: 36 %
MCH: 30 pg (ref 26.6–33.0)
MCHC: 33.8 g/dL (ref 31.5–35.7)
MCV: 89 fL (ref 79–97)
Monocytes Absolute: 0.6 10*3/uL (ref 0.1–0.9)
Monocytes: 9 %
Neutrophils Absolute: 3.9 10*3/uL (ref 1.4–7.0)
Neutrophils: 54 %
Platelets: 319 10*3/uL (ref 150–450)
RBC: 4.64 x10E6/uL (ref 3.77–5.28)
RDW: 12.8 % (ref 11.7–15.4)
WBC: 7.3 10*3/uL (ref 3.4–10.8)

## 2021-06-25 LAB — CARDIOVASCULAR RISK ASSESSMENT

## 2021-06-25 LAB — TSH: TSH: 3.02 u[IU]/mL (ref 0.450–4.500)

## 2021-06-26 LAB — HGB A1C W/O EAG: Hgb A1c MFr Bld: 5.7 % — ABNORMAL HIGH (ref 4.8–5.6)

## 2021-06-26 LAB — SPECIMEN STATUS REPORT

## 2021-07-01 ENCOUNTER — Other Ambulatory Visit: Payer: Self-pay

## 2021-07-01 DIAGNOSIS — E039 Hypothyroidism, unspecified: Secondary | ICD-10-CM

## 2021-07-01 MED ORDER — LEVOTHYROXINE SODIUM 50 MCG PO TABS: 50.0000 ug | ORAL_TABLET | Freq: Every day | ORAL | 1 refills | Status: DC

## 2021-07-03 ENCOUNTER — Encounter: Payer: Self-pay | Admitting: Nurse Practitioner

## 2021-07-29 ENCOUNTER — Other Ambulatory Visit: Payer: Self-pay | Admitting: Specialist

## 2021-07-29 DIAGNOSIS — R918 Other nonspecific abnormal finding of lung field: Secondary | ICD-10-CM

## 2021-08-05 ENCOUNTER — Telehealth: Payer: Self-pay

## 2021-08-05 ENCOUNTER — Telehealth: Payer: Self-pay | Admitting: Nurse Practitioner

## 2021-08-05 ENCOUNTER — Other Ambulatory Visit: Payer: Self-pay

## 2021-08-05 DIAGNOSIS — Z1231 Encounter for screening mammogram for malignant neoplasm of breast: Secondary | ICD-10-CM

## 2021-08-05 DIAGNOSIS — R928 Other abnormal and inconclusive findings on diagnostic imaging of breast: Secondary | ICD-10-CM

## 2021-08-05 NOTE — Telephone Encounter (Signed)
? ?  Sonya Francis has been scheduled for the following appointment: ? ?WHAT: DIAGNOSTIC MAMMOGRAM ?WHERE: [facility] ?DATE: 08/15/21 ?TIME: 10:50 AM CHECK IN ? ?A message has been left for the patient. ? ?

## 2021-08-05 NOTE — Telephone Encounter (Signed)
Left message for patient to call back. Per Carollee Herter mammogram results show Left breast is normal, Right breast asymmetry, recommend diagnostic mammogram and possible [place]. ?

## 2021-08-06 ENCOUNTER — Ambulatory Visit: Payer: Managed Care, Other (non HMO) | Admitting: Nurse Practitioner

## 2021-08-06 ENCOUNTER — Encounter: Payer: Self-pay | Admitting: Nurse Practitioner

## 2021-08-06 VITALS — BP 140/82 | HR 81 | Temp 98.3°F | Ht 68.0 in | Wt 226.0 lb

## 2021-08-06 DIAGNOSIS — Z9981 Dependence on supplemental oxygen: Secondary | ICD-10-CM

## 2021-08-06 DIAGNOSIS — K8020 Calculus of gallbladder without cholecystitis without obstruction: Secondary | ICD-10-CM | POA: Diagnosis not present

## 2021-08-06 DIAGNOSIS — Z6834 Body mass index (BMI) 34.0-34.9, adult: Secondary | ICD-10-CM

## 2021-08-06 DIAGNOSIS — R112 Nausea with vomiting, unspecified: Secondary | ICD-10-CM | POA: Diagnosis not present

## 2021-08-06 DIAGNOSIS — U099 Post covid-19 condition, unspecified: Secondary | ICD-10-CM | POA: Diagnosis not present

## 2021-08-06 DIAGNOSIS — J9611 Chronic respiratory failure with hypoxia: Secondary | ICD-10-CM

## 2021-08-06 MED ORDER — ONDANSETRON 4 MG PO TBDP: 4.0000 mg | ORAL_TABLET | Freq: Three times a day (TID) | ORAL | 1 refills | Status: DC | PRN

## 2021-08-06 MED ORDER — OMEPRAZOLE 20 MG PO CPDR: 20.0000 mg | DELAYED_RELEASE_CAPSULE | Freq: Every day | ORAL | 3 refills | Status: DC

## 2021-08-06 MED ORDER — ONDANSETRON HCL 4 MG PO TABS: 4.0000 mg | ORAL_TABLET | Freq: Three times a day (TID) | ORAL | 1 refills | Status: DC | PRN

## 2021-08-06 NOTE — Patient Instructions (Addendum)
Gallbladder diet ?We will call you with referral appt to general surgeon ?Take Zofran 4 mg as needed ?Begin Omeprazole 20 mg daily ?Follow-up 12/26/21 at 10:00, fasting or sooner if needed ? ?Cholelithiasis ? ?Cholelithiasis happens when gallstones form in the gallbladder. The gallbladder stores bile. Bile is a fluid that helps digest fats. Bile can harden and form into gallstones. If they cause a blockage, they can cause pain (gallbladder attack). ?What are the causes? ?This condition may be caused by: ?Some blood diseases, such as sickle cell anemia. ?Too much of a fat-like substance (cholesterol) in your bile. ?Not enough bile salts in your bile. These salts help the body absorb and digest fats. ?The gallbladder not emptying fully or often enough. This is common in pregnant women. ?What increases the risk? ?The following factors may make you more likely to develop this condition: ?Being female. ?Being pregnant many times. ?Eating a lot of fried foods, fat, and refined carbs (refined carbohydrates). ?Being very overweight (obese). ?Being older than age 17. ?Using medicines with female hormones in them for a long time. ?Losing weight fast. ?Having gallstones in your family. ?Having some health problems, such as diabetes, Crohn's disease, or liver disease. ?What are the signs or symptoms? ?Often, there may be gallstones but no symptoms. These gallstones are called silent gallstones. If a gallstone causes a blockage, you may get sudden pain. The pain: ?Can be in the upper right part of your belly (abdomen). ?Normally comes at night or after you eat. ?Can last an hour or more. ?Can spread to your right shoulder, back, or chest. ?Can feel like discomfort, burning, or fullness in the upper part of your belly (indigestion). ?If the blockage lasts more than a few hours, you can get an infection or swelling. You may: ?Feel like you may vomit. ?Vomit. ?Feel bloated. ?Have belly pain for 5 hours or more. ?Feel tender in your  belly, often in the upper right part and under your ribs. ?Have fever or chills. ?Have skin or the white parts of your eyes turn yellow (jaundice). ?Have dark pee (urine) or pale poop (stool). ?How is this treated? ?Treatment for this condition depends on how bad you feel. If you have symptoms, you may need: ?Home care, if symptoms are not very bad. ?Do not eat for 12-24 hours. Drink only water and clear liquids. ?Start to eat simple or clear foods after 1 or 2 days. Try broths and crackers. ?You may need medicines for pain or stomach upset or both. ?If you have an infection, you will need antibiotics. ?A hospital stay, if you have very bad pain or a very bad infection. ?Surgery to remove your gallbladder. You may need this if: ?Gallstones keep coming back. ?You have very bad symptoms. ?Medicines to break up gallstones. Medicines: ?Are best for small gallstones. ?May be used for up to 6-12 months. ?A procedure to find and take out gallstones or to break up gallstones. ?Follow these instructions at home: ?Medicines ?Take over-the-counter and prescription medicines only as told by your doctor. ?If you were prescribed an antibiotic medicine, take it as told by your doctor. Do not stop taking the antibiotic even if you start to feel better. ?Ask your doctor if the medicine prescribed to you requires you to avoid driving or using machinery. ?Eating and drinking ?Drink enough fluid to keep your urine pale yellow. Drink water or clear fluids. This is important when you have pain. ?Eat healthy foods. Choose: ?Fewer fatty foods, such as fried foods. ?Fewer  refined carbs. Avoid breads and grains that are highly processed, such as white bread and white rice. Choose whole grains, such as whole-wheat bread and brown rice. ?More fiber. Almonds, fresh fruit, and beans are healthy sources. ?General instructions ?Keep a healthy weight. ?Keep all follow-up visits as told by your doctor. This is important. ?Where to find more  information ?[employer] of Diabetes and Digestive and Kidney Diseases: [email or web address] ?Contact a doctor if: ?You have sudden pain in the upper right part of your belly. Pain might spread to your right shoulder, back, or chest. ?You have been diagnosed with gallstones that have no symptoms and you get: ?Belly pain. ?Discomfort, burning, or fullness in the upper part of your abdomen. ?You have dark urine or pale stools. ?Get help right away if: ?You have sudden pain in the upper right part of your abdomen, and the pain lasts more than 2 hours. ?You have pain in your abdomen, and: ?It lasts more than 5 hours. ?It keeps getting worse. ?You have a fever or chills. ?You keep feeling like you may vomit. ?You keep vomiting. ?Your skin or the white parts of your eyes turn yellow. ?Summary ?Cholelithiasis happens when gallstones form in the gallbladder. ?This condition may be caused by a blood disease, too much of a fat-like substance in the bile, or not enough bile salts in bile. ?Treatment for this condition depends on how bad you feel. ?If you have symptoms, do not eat or drink. You may need medicines. You may need a hospital stay for very bad pain or a very bad infection. ?You may need surgery if gallstones keep coming back or if you have very bad symptoms. ?This information is not intended to replace advice given to you by your health care provider. Make sure you discuss any questions you have with your health care provider. ?Document Revised: 05/13/2019 Document Reviewed: 02/14/2019 ?Elsevier Patient Education ? [place]. ? ?Gallbladder Eating Plan ?High blood cholesterol, obesity, a sedentary lifestyle, an unhealthy diet, and diabetes are risk factors for developing gallstones. ?If you have a gallbladder condition, you may have trouble digesting fats and tolerating high fat intake. Eating a low-fat diet can help reduce your symptoms and may be helpful before and after having surgery to remove your  gallbladder (cholecystectomy). Your health care provider may recommend that you work with a dietitian to help you reduce the amount of fat in your diet. ?What are tips for following this plan? ?General guidelines ?Limit your fat intake to less than 30% of your total daily calories. If you eat around 1,800 calories each day, this means eating less than 60 grams (g) of fat per day. ?Fat is an important part of a healthy diet. Eating a low-fat diet can make it hard to maintain a healthy body weight. Ask your dietitian how much fat, calories, and other nutrients you need each day. ?Eat small, frequent meals throughout the day instead of three large meals. ?Drink at least 8-10 cups (1.9-2.4 L) of fluid a day. Drink enough fluid to keep your urine pale yellow. ?If you drink alcohol: ?Limit how much you have to: ?0-1 drink a day for women who are not pregnant. ?0-2 drinks a day for men. ?Know how much alcohol is in a drink. In the [place] one drink equals one 12 oz bottle of beer (355 mL), one 5 oz glass of wine (148 mL), or one 1? oz glass of hard liquor (44 mL). ?Reading food labels ? ?Check nutrition facts on  food labels for the amount of fat per serving. Choose foods with less than 3 grams of fat per serving. ?Shopping ?Choose nonfat and low-fat healthy foods. Look for the words "nonfat," "low-fat," or "fat-free." ?Avoid buying processed or prepackaged foods. ?Cooking ?Cook using low-fat methods, such as baking, broiling, grilling, or boiling. ?Cook with small amounts of healthy fats, such as olive oil, grapeseed oil, canola oil, avocado oil, or sunflower oil. ?What foods are recommended? ? ?All fresh, frozen, or canned fruits and vegetables. ?Whole grains. ?Low-fat or nonfat (skim) milk and yogurt. ?Lean meat, skinless poultry, fish, eggs, and beans. ?Low-fat protein supplement powders or drinks. ?Spices and herbs. ?The items listed above may not be a complete list of foods and beverages you can eat and drink. Contact a  dietitian for more information. ?What foods are not recommended? ?High-fat foods. These include baked goods, fast food, fatty cuts of meat, ice cream, french toast, sweet rolls, pizza, cheese bread, foods co

## 2021-08-06 NOTE — Progress Notes (Signed)
? ?Acute Office Visit ? ?Subjective:  ? ?  ?Patient ID: Sonya Francis, female    DOB: 1965/05/31, 56 y.o.   MRN: [id number] ? ?CC: ?Nausea and vomiting ? ?HPI ?Patient is in today for evaluation of nausea, vomiting, and RUQ radiating to her back. Onset of symptoms was a few weeks ago. Reports known cholelithiasis as an incidental finding on chest CTs from April and July 2022. 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 She is followed by Dr Blenda Nicely, pulmonologist. States she has weaned herself off of O2 to PRN at night.States she has fatigue and dyspnea with activity.  ? ?Review of Systems  ?Constitutional:  Positive for malaise/fatigue.  ?Respiratory:  Positive for shortness of breath (with exertion).   ?Gastrointestinal:  Positive for abdominal pain, nausea and vomiting.  ?Musculoskeletal:  Positive for back pain (chronic).  ?All other systems reviewed and are negative. ? ? ?   ?Objective:  ?  ?BP 140/82   Pulse 81   Temp 98.3 ?F (36.8 ?C)   Ht 5\' 8"  (1.727 m)   Wt 226 lb (102.5 kg)   SpO2 95%   BMI 34.36 kg/m?  ? ? ?Physical Exam ?Vitals reviewed.  ?Constitutional:   ?   Appearance: She is obese.  ?HENT:  ?   Right Ear: Tympanic membrane normal.  ?   Left Ear: Tympanic membrane normal.  ?Cardiovascular:  ?   Rate and Rhythm: Normal rate and regular rhythm.  ?   Pulses: Normal pulses.  ?   Heart sounds: Normal heart sounds.  ?Pulmonary:  ?   Effort: Pulmonary effort is normal.  ?   Breath sounds: Normal breath sounds.  ?Abdominal:  ?   General: Bowel sounds are normal.  ?   Palpations: Abdomen is soft.  ?   Tenderness: There is abdominal tenderness (RUQ, positive Murphy's sign). There is no right CVA tenderness or left CVA tenderness.  ?Skin: ?   General: Skin is warm and dry.  ?   Capillary Refill: Capillary refill takes less than 2 seconds.  ?Neurological:  ?   General: No focal deficit present.  ?   Mental Status: She is alert and oriented to person, place, and time.   ? ? ? ? ? ?   ?Assessment & Plan:  ? ?1. Calculus of gallbladder without cholecystitis without obstruction ?- omeprazole (PRILOSEC) 20 MG capsule; Take 1 capsule (20 mg total) by mouth daily.  Dispense: 90 capsule; Refill: 3 ?-low fat gallbladder diet ? ?2. Nausea and vomiting, unspecified vomiting type ?- ondansetron (ZOFRAN-ODT) 4 MG disintegrating tablet; Take 1 tablet (4 mg total) by mouth every 8 (eight) hours as needed for nausea or vomiting.  Dispense: 30 tablet; Refill: 1 ? ?3. COVID-19 long hauler ?-continue to increase physical activity gradually ?-continue O2 PRN as prescribed ? ?4. Chronic respiratory failure with hypoxia, on home O2 therapy (HCC) ?-continue O2 PRN as prescribed ?-follow-up with Dr as scheduled ? ?5. BMI 34.0-34.9,adult ?-low fat gallbladder diet ?-increase physical activity ?  ? ?Gallbladder diet ?We will call you with referral appt to general surgeon ?Take Zofran 4 mg as needed ?Begin Omeprazole 20 mg daily ?Follow-up 12/26/21 at 10:00, fasting or sooner if needed ? ?Follow-up: 12/26/21 at 10:00 ? ?I, 12/28/21, NP, have reviewed all documentation for this visit. The documentation on 08/06/21 for the exam, diagnosis, procedures, and orders are all accurate and complete.  ? ? ?Signed, ?10/06/21, NP ? ? ?

## 2021-08-07 ENCOUNTER — Encounter: Payer: Self-pay | Admitting: Nurse Practitioner

## 2021-08-08 ENCOUNTER — Other Ambulatory Visit: Payer: Self-pay

## 2021-08-08 DIAGNOSIS — K8020 Calculus of gallbladder without cholecystitis without obstruction: Secondary | ICD-10-CM

## 2021-08-08 DIAGNOSIS — R918 Other nonspecific abnormal finding of lung field: Secondary | ICD-10-CM

## 2021-08-13 ENCOUNTER — Other Ambulatory Visit: Payer: Self-pay | Admitting: Nurse Practitioner

## 2021-08-16 ENCOUNTER — Other Ambulatory Visit: Payer: Self-pay | Admitting: Specialist

## 2021-08-16 ENCOUNTER — Ambulatory Visit: Admission: RE | Admit: 2021-08-16 | Payer: Commercial Managed Care - HMO | Source: Ambulatory Visit

## 2021-08-16 ENCOUNTER — Ambulatory Visit
Admission: RE | Admit: 2021-08-16 | Discharge: 2021-08-16 | Disposition: A | Discharge status: Home / Self Care | Payer: Commercial Managed Care - HMO | Source: Ambulatory Visit | Attending: Specialist | Admitting: Specialist

## 2021-08-16 DIAGNOSIS — R918 Other nonspecific abnormal finding of lung field: Secondary | ICD-10-CM

## 2021-08-19 ENCOUNTER — Telehealth: Payer: Self-pay

## 2021-08-19 NOTE — Telephone Encounter (Signed)
Left message for patient to call our office.er Carollee Herter patients [place] of right breast was mildly suspicious, recommend a stereotactic right breast biopsy per guidelines. ?

## 2021-08-20 ENCOUNTER — Other Ambulatory Visit: Payer: Self-pay

## 2021-08-20 DIAGNOSIS — R928 Other abnormal and inconclusive findings on diagnostic imaging of breast: Secondary | ICD-10-CM

## 2021-08-25 ENCOUNTER — Other Ambulatory Visit: Payer: Self-pay | Admitting: Nurse Practitioner

## 2021-08-25 DIAGNOSIS — R000 Tachycardia, unspecified: Secondary | ICD-10-CM

## 2021-08-26 ENCOUNTER — Other Ambulatory Visit: Payer: Self-pay | Admitting: Nurse Practitioner

## 2021-08-26 DIAGNOSIS — K8020 Calculus of gallbladder without cholecystitis without obstruction: Secondary | ICD-10-CM

## 2021-08-26 DIAGNOSIS — R928 Other abnormal and inconclusive findings on diagnostic imaging of breast: Secondary | ICD-10-CM

## 2021-09-04 ENCOUNTER — Ambulatory Visit
Admission: RE | Admit: 2021-09-04 | Discharge: 2021-09-04 | Disposition: A | Discharge status: Home / Self Care | Payer: Commercial Managed Care - HMO | Source: Ambulatory Visit | Attending: Nurse Practitioner | Admitting: Nurse Practitioner

## 2021-09-04 ENCOUNTER — Other Ambulatory Visit: Payer: Self-pay | Admitting: Radiology

## 2021-09-04 DIAGNOSIS — R928 Other abnormal and inconclusive findings on diagnostic imaging of breast: Secondary | ICD-10-CM

## 2021-09-05 HISTORY — PX: CHOLECYSTECTOMY: SHX55

## 2021-11-11 ENCOUNTER — Other Ambulatory Visit: Payer: Self-pay | Admitting: Nurse Practitioner

## 2021-11-20 ENCOUNTER — Other Ambulatory Visit: Payer: Self-pay | Admitting: Nurse Practitioner

## 2021-11-20 DIAGNOSIS — R000 Tachycardia, unspecified: Secondary | ICD-10-CM

## 2021-12-19 ENCOUNTER — Other Ambulatory Visit: Payer: Self-pay | Admitting: Nurse Practitioner

## 2021-12-19 DIAGNOSIS — E039 Hypothyroidism, unspecified: Secondary | ICD-10-CM

## 2021-12-26 ENCOUNTER — Encounter: Payer: Self-pay | Admitting: Nurse Practitioner

## 2021-12-26 ENCOUNTER — Ambulatory Visit (INDEPENDENT_AMBULATORY_CARE_PROVIDER_SITE_OTHER): Payer: Commercial Managed Care - HMO | Admitting: Nurse Practitioner

## 2021-12-26 VITALS — BP 128/74 | HR 78 | Temp 97.2°F | Ht 65.0 in | Wt 215.0 lb

## 2021-12-26 DIAGNOSIS — Z6835 Body mass index (BMI) 35.0-35.9, adult: Secondary | ICD-10-CM

## 2021-12-26 DIAGNOSIS — Z9981 Dependence on supplemental oxygen: Secondary | ICD-10-CM

## 2021-12-26 DIAGNOSIS — J9611 Chronic respiratory failure with hypoxia: Secondary | ICD-10-CM | POA: Diagnosis not present

## 2021-12-26 DIAGNOSIS — E66812 Obesity, class 2: Secondary | ICD-10-CM

## 2021-12-26 DIAGNOSIS — J849 Interstitial pulmonary disease, unspecified: Secondary | ICD-10-CM | POA: Diagnosis not present

## 2021-12-26 DIAGNOSIS — R7303 Prediabetes: Secondary | ICD-10-CM

## 2021-12-26 DIAGNOSIS — E039 Hypothyroidism, unspecified: Secondary | ICD-10-CM | POA: Diagnosis not present

## 2021-12-26 NOTE — Progress Notes (Signed)
Subjective:  Patient ID: Sonya Francis, female    DOB: 10/03/1965  Age: 56 y.o. MRN: [id number]  Chief Complaint  Patient presents with   Hypothyroidism    HPI Pt presents for follow-up for prediabetes, hypothyroidism, and chronic hypoxia related to COVID-19. She is followed by Dr Alcide Clever, pulmonologist. Currently uses O2 PRN. States she rests when she experiences dyspnea.  She denies any acute medical problems today. She declined flu vaccine. Past due for routine eye exam. Underwent cholecystectomy 09/2021 with Dr Orrin Brigham.   Prediabetes, Follow-up  Lab Results  Component Value Date   HGBA1C 5.7 (H) 06/24/2021   GLUCOSE 105 (H) 06/24/2021   GLUCOSE 105 (H) 03/25/2021   GLUCOSE 108 (H) 05/11/2020    Last seen for for this 6 months ago.  Management since that visit includes diet modification. Current symptoms include none and have been stable.  Prior visit with dietician: no Current diet: well balanced Current exercise: none, limited by chronic respiratory failure requiring O2.   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 She is followed by Dr Alcide Clever, pulmonologist. Currently wears O2 PRN. She is permanently disabled due to chronic hypoxia.   Current Outpatient Medications on File Prior to Visit  Medication Sig Dispense Refill   albuterol (VENTOLIN HFA) 108 (90 Base) MCG/ACT inhaler Inhale 2 puffs into the lungs every 4 (four) hours as needed for wheezing.     fluticasone  (FLONASE) 50 MCG/ACT nasal spray Place 1 spray into both nostrils daily.     levothyroxine (SYNTHROID) 50 MCG tablet TAKE 1 TABLET BY MOUTH EVERY DAY 30 tablet 5   loratadine (CLARITIN) 10 MG tablet TAKE 1 TABLET BY MOUTH EVERY DAY 90 tablet 0   metoprolol succinate (TOPROL-XL) 25 MG 24 hr tablet TAKE 1 TABLET (25 MG TOTAL) BY MOUTH DAILY. 30 tablet 1   Multiple Vitamins-Minerals (ONE-A-DAY WOMENS PO) Take by mouth. With vitamin d     torsemide (DEMADEX) 5 MG tablet TAKE 1 TABLET (5 MG TOTAL) BY MOUTH DAILY. 30 tablet 2   No current facility-administered medications on file prior to visit.   Past Medical History:  Diagnosis Date   Elevated TSH    Tendonitis of ankle or foot 08/13/2015   Past Surgical History:  Procedure Laterality Date   NONE      Family History  Problem Relation Age of Onset   Cancer Mother    Cancer Father    Social History   Socioeconomic History   Marital status: Married    Spouse name: Not on file   Number of children: Not on file   Years of education: Not on file   Highest education level: Not on file  Occupational History   Not on file  Tobacco Use   Smoking status: Never   Smokeless tobacco: Never  Vaping Use   Vaping Use: Never used  Substance and Sexual Activity   Alcohol use: Never   Drug use: Never  Sexual activity: Not on file  Other Topics Concern   Not on file  Social History Narrative   Not on file   Social Determinants of Health   Financial Resource Strain: Low Risk  (12/26/2021)   Overall Financial Resource Strain (CARDIA)    Difficulty of Paying Living Expenses: Not hard at all  Food Insecurity: No Food Insecurity (12/26/2021)   Hunger Vital Sign    Worried About Running Out of Food in the Last Year: Never true    Ran Out of Food in the Last Year: Never true  Transportation Needs: No Transportation Needs (12/26/2021)   PRAPARE - Administrator, Civil Service (Medical): No    Lack of Transportation (Non-Medical): No   Physical Activity: Inactive (12/26/2021)   Exercise Vital Sign    Days of Exercise per Week: 0 days    Minutes of Exercise per Session: 0 min  Stress: No Stress Concern Present (12/26/2021)   [employer] of Occupational Health - Occupational Stress Questionnaire    Feeling of Stress : Not at all  Social Connections: Moderately Isolated (12/26/2021)   Social Connection and Isolation Panel [NHANES]    Frequency of Communication with Friends and Family: More than three times a week    Frequency of Social Gatherings with Friends and Family: More than three times a week    Attends Religious Services: Never    Database administrator or Organizations: No    Attends Banker Meetings: Never    Marital Status: Married    Review of Systems  Constitutional:  Positive for fatigue. Negative for chills and fever.  HENT:  Negative for congestion, ear pain, rhinorrhea and sore throat.   Respiratory:  Positive for shortness of breath (intermittent). Negative for cough.   Cardiovascular:  Negative for chest pain.  Gastrointestinal:  Negative for abdominal pain, constipation, diarrhea, nausea and vomiting.  Genitourinary:  Negative for dysuria and urgency.  Musculoskeletal:  Positive for back pain. Negative for myalgias.  Neurological:  Negative for dizziness, weakness, light-headedness and headaches.  Psychiatric/Behavioral:  Negative for dysphoric mood. The patient is not nervous/anxious.      Objective:  BP 128/74   Pulse 78   Temp (!) 97.2 F (36.2 C)   Ht 5\' 5"  (1.651 m)   Wt 215 lb (97.5 kg)   SpO2 100%   BMI 35.78 kg/m      12/26/2021   10:03 AM 08/06/2021    9:22 AM 06/24/2021    9:07 AM  BP/Weight  Systolic BP 128 140 126  Diastolic BP 74 82 84  Wt. (Lbs) 215 226 228  BMI 35.78 kg/m2 34.36 kg/m2 34.67 kg/m2    Physical Exam Vitals reviewed.  Constitutional:      Appearance: She is obese.  HENT:     Right Ear: Tympanic membrane normal.     Left Ear:  Tympanic membrane normal.  Cardiovascular:     Rate and Rhythm: Normal rate and regular rhythm.     Pulses: Normal pulses.     Heart sounds: Normal heart sounds.  Pulmonary:     Effort: Pulmonary effort is normal.     Breath sounds: Normal breath sounds.     Comments: Diminished in bilateral posterior lobes Abdominal:     General: Bowel sounds are normal.     Palpations: Abdomen is soft.  Skin:    General: Skin is warm and dry.     Capillary Refill: Capillary refill takes less than 2 seconds.  Neurological:     General: No focal deficit present.     Mental Status: She is alert and oriented to person, place, and time.    Lab Results  Component Value Date   WBC 7.3 06/24/2021   HGB 13.9 06/24/2021   HCT 41.1 06/24/2021   PLT 319 06/24/2021   GLUCOSE 105 (H) 06/24/2021   CHOL 153 06/24/2021   TRIG 75 06/24/2021   HDL 49 06/24/2021   LDLCALC 89 06/24/2021   ALT 28 06/24/2021   AST 23 06/24/2021   NA 141 06/24/2021   K 4.7 06/24/2021   CL 105 06/24/2021   CREATININE 0.85 06/24/2021   BUN 13 06/24/2021   CO2 23 06/24/2021   TSH 3.020 06/24/2021   HGBA1C 5.7 (H) 06/24/2021      Assessment & Plan:   1. Prediabetes-not at goal - CBC with Differential/Platelet - Comprehensive metabolic panel - Lipid panel - Hemoglobin A1c  2. Hypothyroidism (acquired)-well controlled - TSH - T4, Free -continue Levothyroxine 50 mcg QD  3. Lung interstitial disease (HCC)-well controlled - CBC with Differential/Platelet - Comprehensive metabolic panel -continue Albuterol inhaler as prescribed  4. Chronic respiratory failure with hypoxia, on home O2 therapy (HCC) - CBC with Differential/Platelet - Comprehensive metabolic panel -continue O2 as prescribed -follow-up with pulmonologist as scheduled  5. Class 2 severe obesity due to excess calories with serious comorbidity and body mass index (BMI) of 35.0 to 35.9 in adult (HCC) - CBC with Differential/Platelet - Comprehensive  metabolic panel - Lipid panel - TSH - T4, Free - Hemoglobin A1c   Continue medications We will call you with lab results Follow-up in 38-months     Follow-up: 63-months  An After Visit Summary was printed and given to the patient.  I, Janie Morning, NP, have reviewed all documentation for this visit. The documentation on 12/26/21 for the exam, diagnosis, procedures, and orders are all accurate and complete.   Signed, Janie Morning, NP [facility] [phone]

## 2021-12-26 NOTE — Patient Instructions (Signed)
Continue medications We will call you with lab results Follow-up in 76-months Prediabetes Prediabetes is when your blood sugar (blood glucose) level is higher than normal but not high enough for you to be diagnosed with type 2 diabetes. Having prediabetes puts you at risk for developing type 2 diabetes (type 2 diabetes mellitus). With certain lifestyle changes, you may be able to prevent or delay the onset of type 2 diabetes. This is important because type 2 diabetes can lead to serious complications, such as: Heart disease. Stroke. Blindness. Kidney disease. Depression. Poor circulation in the feet and legs. In severe cases, this could lead to surgical removal of a leg (amputation). What are the causes? The exact cause of prediabetes is not known. It may result from insulin resistance. Insulin resistance develops when cells in the body do not respond properly to insulin that the body makes. This can cause excess glucose to build up in the blood. High blood glucose (hyperglycemia) can develop. What increases the risk? The following factors may make you more likely to develop this condition: You have a family member with type 2 diabetes. You are older than 45 years. You had a temporary form of diabetes during a pregnancy (gestational diabetes). You had polycystic ovary syndrome (PCOS). You are overweight or obese. You are inactive (sedentary). You have a history of heart disease, including problems with cholesterol levels, high levels of blood fats, or high blood pressure. What are the signs or symptoms? You may have no symptoms. If you do have symptoms, they may include: Increased hunger. Increased thirst. Increased urination. Vision changes, such as blurry vision. Tiredness (fatigue). How is this diagnosed? This condition can be diagnosed with blood tests. Your blood glucose may be checked with one or more of the following tests: A fasting blood glucose (FBG) test. You will not be  allowed to eat (you will fast) for at least 8 hours before a blood sample is taken. An A1C blood test (hemoglobin A1C). This test provides information about blood glucose levels over the previous 2?3 months. An oral glucose tolerance test (OGTT). This test measures your blood glucose at two points in time: After fasting. This is your baseline level. Two hours after you drink a beverage that contains glucose. You may be diagnosed with prediabetes if: Your FBG is 100?125 mg/dL (5.6-6.9 mmol/L). Your A1C level is 5.7?6.4% (39-46 mmol/mol). Your OGTT result is 140?199 mg/dL (7.8-11 mmol/L). These blood tests may be repeated to confirm your diagnosis. How is this treated? Treatment may include dietary and lifestyle changes to help lower your blood glucose and prevent type 2 diabetes from developing. In some cases, medicine may be prescribed to help lower the risk of type 2 diabetes. Follow these instructions at home: Nutrition  Follow a healthy meal plan. This includes eating lean proteins, whole grains, legumes, fresh fruits and vegetables, low-fat dairy products, and healthy fats. Follow instructions from your health care provider about eating or drinking restrictions. Meet with a dietitian to create a healthy eating plan that is right for you. Lifestyle Do moderate-intensity exercise for at least 30 minutes a day on 5 or more days each week, or as told by your health care provider. A mix of activities may be best, such as: Brisk walking, swimming, biking, and weight lifting. Lose weight as told by your health care provider. Losing 5-7% of your body weight can reverse insulin resistance. Do not drink alcohol if: Your health care provider tells you not to drink. You are pregnant, may  be pregnant, or are planning to become pregnant. If you drink alcohol: Limit how much you use to: 0-1 drink a day for women. 0-2 drinks a day for men. Be aware of how much alcohol is in your drink. In the [place]  one drink equals one 12 oz bottle of beer (355 mL), one 5 oz glass of wine (148 mL), or one 1 oz glass of hard liquor (44 mL). General instructions Take over-the-counter and prescription medicines only as told by your health care provider. You may be prescribed medicines that help lower the risk of type 2 diabetes. Do not use any products that contain nicotine or tobacco, such as cigarettes, e-cigarettes, and chewing tobacco. If you need help quitting, ask your health care provider. Keep all follow-up visits. This is important. Where to find more information American Diabetes Association: www.diabetes.org Academy of Nutrition and Dietetics: www.eatright.org American Heart Association: www.heart.org Contact a health care provider if: You have any of these symptoms: Increased hunger. Increased urination. Increased thirst. Fatigue. Vision changes, such as blurry vision. Get help right away if you: Have shortness of breath. Feel confused. Vomit or feel like you may vomit. Summary Prediabetes is when your blood sugar (blood glucose)level is higher than normal but not high enough for you to be diagnosed with type 2 diabetes. Having prediabetes puts you at risk for developing type 2 diabetes (type 2 diabetes mellitus). Make lifestyle changes such as eating a healthy diet and exercising regularly to help prevent diabetes. Lose weight as told by your health care provider. This information is not intended to replace advice given to you by your health care provider. Make sure you discuss any questions you have with your health care provider. Document Revised: 06/23/2019 Document Reviewed: 06/23/2019 Elsevier Patient Education  [place]. Prediabetes Eating Plan Prediabetes is a condition that causes blood sugar (glucose) levels to be higher than normal. This increases the risk for developing type 2 diabetes (type 2 diabetes mellitus). Working with a health care provider or nutrition  specialist (dietitian) to make diet and lifestyle changes can help prevent the onset of diabetes. These changes may help you: Control your blood glucose levels. Improve your cholesterol levels. Manage your blood pressure. What are tips for following this plan? Reading food labels Read food labels to check the amount of fat, salt (sodium), and sugar in prepackaged foods. Avoid foods that have: Saturated fats. Trans fats. Added sugars. Avoid foods that have more than 300 milligrams (mg) of sodium per serving. Limit your sodium intake to less than 2,300 mg each day. Shopping Avoid buying pre-made and processed foods. Avoid buying drinks with added sugar. Cooking Cook with olive oil. Do not use butter, lard, or ghee. Bake, broil, grill, steam, or boil foods. Avoid frying. Meal planning  Work with your dietitian to create an eating plan that is right for you. This may include tracking how many calories you take in each day. Use a food diary, notebook, or mobile application to track what you eat at each meal. Consider following a Mediterranean diet. This includes: Eating several servings of fresh fruits and vegetables each day. Eating fish at least twice a week. Eating one serving each day of whole grains, beans, nuts, and seeds. Using olive oil instead of other fats. Limiting alcohol. Limiting red meat. Using nonfat or low-fat dairy products. Consider following a plant-based diet. This includes dietary choices that focus on eating mostly vegetables and fruit, grains, beans, nuts, and seeds. If you have high blood  pressure, you may need to limit your sodium intake or follow a diet such as the DASH (Dietary Approaches to Stop Hypertension) eating plan. The DASH diet aims to lower high blood pressure. Lifestyle Set weight loss goals with help from your health care team. It is recommended that most people with prediabetes lose 7% of their body weight. Exercise for at least 30 minutes 5 or more  days a week. Attend a support group or seek support from a mental health counselor. Take over-the-counter and prescription medicines only as told by your health care provider. What foods are recommended? Fruits Berries. Bananas. Apples. Oranges. Grapes. Papaya. Mango. Pomegranate. Kiwi. Grapefruit. Cherries. Vegetables Lettuce. Spinach. Peas. Beets. Cauliflower. Cabbage. Broccoli. Carrots. Tomatoes. Squash. Eggplant. Herbs. Peppers. Onions. Cucumbers. Brussels sprouts. Grains Whole grains, such as whole-wheat or whole-grain breads, crackers, cereals, and pasta. Unsweetened oatmeal. Bulgur. Barley. Quinoa. Brown rice. Corn or whole-wheat flour tortillas or taco shells. Meats and other proteins Seafood. Poultry without skin. Lean cuts of pork and beef. Tofu. Eggs. Nuts. Beans. Dairy Low-fat or fat-free dairy products, such as yogurt, cottage cheese, and cheese. Beverages Water. Tea. Coffee. Sugar-free or diet soda. Seltzer water. Low-fat or nonfat milk. Milk alternatives, such as soy or almond milk. Fats and oils Olive oil. Canola oil. Sunflower oil. Grapeseed oil. Avocado. Walnuts. Sweets and desserts Sugar-free or low-fat pudding. Sugar-free or low-fat ice cream and other frozen treats. Seasonings and condiments Herbs. Sodium-free spices. Mustard. Relish. Low-salt, low-sugar ketchup. Low-salt, low-sugar barbecue sauce. Low-fat or fat-free mayonnaise. The items listed above may not be a complete list of recommended foods and beverages. Contact a dietitian for more information. What foods are not recommended? Fruits Fruits canned with syrup. Vegetables Canned vegetables. Frozen vegetables with butter or cream sauce. Grains Refined white flour and flour products, such as bread, pasta, snack foods, and cereals. Meats and other proteins Fatty cuts of meat. Poultry with skin. Breaded or fried meat. Processed meats. Dairy Full-fat yogurt, cheese, or milk. Beverages Sweetened drinks, such  as iced tea and soda. Fats and oils Butter. Lard. Ghee. Sweets and desserts Baked goods, such as cake, cupcakes, pastries, cookies, and cheesecake. Seasonings and condiments Spice mixes with added salt. Ketchup. Barbecue sauce. Mayonnaise. The items listed above may not be a complete list of foods and beverages that are not recommended. Contact a dietitian for more information. Where to find more information American Diabetes Association: www.diabetes.org Summary You may need to make diet and lifestyle changes to help prevent the onset of diabetes. These changes can help you control blood sugar, improve cholesterol levels, and manage blood pressure. Set weight loss goals with help from your health care team. It is recommended that most people with prediabetes lose 7% of their body weight. Consider following a Mediterranean diet. This includes eating plenty of fresh fruits and vegetables, whole grains, beans, nuts, seeds, fish, and low-fat dairy, and using olive oil instead of other fats. This information is not intended to replace advice given to you by your health care provider. Make sure you discuss any questions you have with your health care provider. Document Revised: 06/23/2019 Document Reviewed: 06/23/2019 Elsevier Patient Education  [place].    Hypothyroidism  Hypothyroidism is when the thyroid gland does not make enough of certain hormones. This is called an underactive thyroid. The thyroid gland is a small gland located in the lower front part of the neck, just in front of the windpipe (trachea). This gland makes hormones that help control how the body  uses food for energy (metabolism) as well as how the heart and brain function. These hormones also play a role in keeping your bones strong. When the thyroid is underactive, it produces too little of the hormones thyroxine (T4) and triiodothyronine (T3). What are the causes? This condition may be caused by: Hashimoto's  disease. This is a disease in which the body's disease-fighting system (immune system) attacks the thyroid gland. This is the most common cause. Viral infections. Pregnancy. Certain medicines. Birth defects. Problems with a gland in the center of the brain (pituitary gland). Lack of enough iodine in the diet. Other causes may include: Past radiation treatments to the head or neck for cancer. Past treatment with radioactive iodine. Past exposure to radiation in the environment. Past surgical removal of part or all of the thyroid. What increases the risk? You are more likely to develop this condition if: You are female. You have a family history of thyroid conditions. You use a medicine called lithium. You take medicines that affect the immune system (immunosuppressants). What are the signs or symptoms? Common symptoms of this condition include: Not being able to tolerate cold. Feeling as though you have no energy (lethargy). Lack of appetite. Constipation. Sadness or depression. Weight gain that is not explained by a change in diet or exercise habits. Menstrual irregularity. Dry skin, coarse hair, or brittle nails. Other symptoms may include: Muscle pain. Slowing of thought processes. Poor memory. How is this diagnosed? This condition may be diagnosed based on: Your symptoms, your medical history, and a physical exam. Blood tests. You may also have imaging tests, such as an ultrasound or MRI. How is this treated? This condition is treated with medicine that replaces the thyroid hormones that your body does not make. After you begin treatment, it may take several weeks for symptoms to go away. Follow these instructions at home: Take over-the-counter and prescription medicines only as told by your health care provider. If you start taking any new medicines, tell your health care provider. Keep all follow-up visits as told by your health care provider. This is important. As  your condition improves, your dosage of thyroid hormone medicine may change. You will need to have blood tests regularly so that your health care provider can monitor your condition. Contact a health care provider if: Your symptoms do not get better with treatment. You are taking thyroid hormone replacement medicine and you: Sweat a lot. Have tremors. Feel anxious. Lose weight rapidly. Cannot tolerate heat. Have emotional swings. Have diarrhea. Feel weak. Get help right away if: You have chest pain. You have an irregular heartbeat. You have a rapid heartbeat. You have difficulty breathing. These symptoms may be an emergency. Get help right away. Call 911. Do not wait to see if the symptoms will go away. Do not drive yourself to the hospital. Summary Hypothyroidism is when the thyroid gland does not make enough of certain hormones (it is underactive). When the thyroid is underactive, it produces too little of the hormones thyroxine (T4) and triiodothyronine (T3). The most common cause is Hashimoto's disease, a disease in which the body's disease-fighting system (immune system) attacks the thyroid gland. The condition can also be caused by viral infections, medicine, pregnancy, or past radiation treatment to the head or neck. Symptoms may include weight gain, dry skin, constipation, feeling as though you do not have energy, and not being able to tolerate cold. This condition is treated with medicine to replace the thyroid hormones that your body does not  make. This information is not intended to replace advice given to you by your health care provider. Make sure you discuss any questions you have with your health care provider. Document Revised: 03/26/2021 Document Reviewed: 03/26/2021 Elsevier Patient Education  [place].

## 2021-12-28 LAB — CBC WITH DIFFERENTIAL/PLATELET
Basophils Absolute: 0 10*3/uL (ref 0.0–0.2)
Basos: 0 %
EOS (ABSOLUTE): 0.1 10*3/uL (ref 0.0–0.4)
Eos: 1 %
Hematocrit: 41.5 % (ref 34.0–46.6)
Hemoglobin: 14.1 g/dL (ref 11.1–15.9)
Immature Grans (Abs): 0 10*3/uL (ref 0.0–0.1)
Immature Granulocytes: 0 %
Lymphocytes Absolute: 2.7 10*3/uL (ref 0.7–3.1)
Lymphs: 34 %
MCH: 30.9 pg (ref 26.6–33.0)
MCHC: 34 g/dL (ref 31.5–35.7)
MCV: 91 fL (ref 79–97)
Monocytes Absolute: 0.6 10*3/uL (ref 0.1–0.9)
Monocytes: 7 %
Neutrophils Absolute: 4.5 10*3/uL (ref 1.4–7.0)
Neutrophils: 58 %
Platelets: 327 10*3/uL (ref 150–450)
RBC: 4.57 x10E6/uL (ref 3.77–5.28)
RDW: 13.2 % (ref 11.7–15.4)
WBC: 7.9 10*3/uL (ref 3.4–10.8)

## 2021-12-28 LAB — COMPREHENSIVE METABOLIC PANEL
ALT: 24 IU/L (ref 0–32)
AST: 18 IU/L (ref 0–40)
Albumin/Globulin Ratio: 1.9 (ref 1.2–2.2)
Albumin: 4.5 g/dL (ref 3.8–4.9)
Alkaline Phosphatase: 147 IU/L — ABNORMAL HIGH (ref 44–121)
BUN/Creatinine Ratio: 17 (ref 9–23)
BUN: 14 mg/dL (ref 6–24)
Bilirubin Total: 0.4 mg/dL (ref 0.0–1.2)
CO2: 24 mmol/L (ref 20–29)
Calcium: 9.6 mg/dL (ref 8.7–10.2)
Chloride: 103 mmol/L (ref 96–106)
Creatinine, Ser: 0.81 mg/dL (ref 0.57–1.00)
Globulin, Total: 2.4 g/dL (ref 1.5–4.5)
Glucose: 104 mg/dL — ABNORMAL HIGH (ref 70–99)
Potassium: 5.1 mmol/L (ref 3.5–5.2)
Sodium: 143 mmol/L (ref 134–144)
Total Protein: 6.9 g/dL (ref 6.0–8.5)
eGFR: 84 mL/min/{1.73_m2} (ref >59)

## 2021-12-28 LAB — LIPID PANEL
Chol/HDL Ratio: 3.9 ratio (ref 0.0–4.4)
Cholesterol, Total: 180 mg/dL (ref 100–199)
HDL: 46 mg/dL (ref >39)
LDL Chol Calc (NIH): 115 mg/dL — ABNORMAL HIGH (ref 0–99)
Triglycerides: 105 mg/dL (ref 0–149)
VLDL Cholesterol Cal: 19 mg/dL (ref 5–40)

## 2021-12-28 LAB — HEMOGLOBIN A1C
Est. average glucose Bld gHb Est-mCnc: 111 mg/dL
Hgb A1c MFr Bld: 5.5 % (ref 4.8–5.6)

## 2021-12-28 LAB — CARDIOVASCULAR RISK ASSESSMENT

## 2021-12-28 LAB — TSH: TSH: 1.74 u[IU]/mL (ref 0.450–4.500)

## 2021-12-28 LAB — T4, FREE: Free T4: 1.57 ng/dL (ref 0.82–1.77)

## 2022-01-18 ENCOUNTER — Other Ambulatory Visit: Payer: Self-pay | Admitting: Family Medicine

## 2022-01-18 DIAGNOSIS — R000 Tachycardia, unspecified: Secondary | ICD-10-CM

## 2022-02-19 ENCOUNTER — Other Ambulatory Visit: Payer: Self-pay

## 2022-02-19 ENCOUNTER — Telehealth: Payer: Self-pay

## 2022-02-19 DIAGNOSIS — E039 Hypothyroidism, unspecified: Secondary | ICD-10-CM

## 2022-02-19 DIAGNOSIS — R000 Tachycardia, unspecified: Secondary | ICD-10-CM

## 2022-02-19 MED ORDER — METOPROLOL SUCCINATE ER 25 MG PO TB24: 25.0000 mg | ORAL_TABLET | Freq: Every day | ORAL | 1 refills | Status: DC

## 2022-02-19 MED ORDER — LEVOTHYROXINE SODIUM 50 MCG PO TABS: 50.0000 ug | ORAL_TABLET | Freq: Every day | ORAL | 1 refills | Status: DC

## 2022-02-19 MED ORDER — TORSEMIDE 5 MG PO TABS: 5.0000 mg | ORAL_TABLET | Freq: Every day | ORAL | 1 refills | Status: DC

## 2022-02-19 NOTE — Telephone Encounter (Signed)
Patient came in this morning requesting a 90 supply due to cost for patient would be cheaper. She would like the following medications sent to [facility] in [place]:  metoprolol succinate (TOPROL-XL) 25 MG 24 hr tablet torsemide (DEMADEX) 5 MG tablet levothyroxine (SYNTHROID) 50 MCG tablet

## 2022-06-30 ENCOUNTER — Ambulatory Visit: Payer: Commercial Managed Care - HMO | Admitting: Nurse Practitioner

## 2022-07-31 ENCOUNTER — Ambulatory Visit: Payer: Commercial Managed Care - HMO | Admitting: Nurse Practitioner

## 2022-08-07 ENCOUNTER — Other Ambulatory Visit: Payer: Self-pay

## 2022-08-07 MED ORDER — TORSEMIDE 5 MG PO TABS: 5.0000 mg | ORAL_TABLET | Freq: Every day | ORAL | 0 refills | Status: DC

## 2022-08-25 ENCOUNTER — Telehealth: Payer: Self-pay

## 2022-08-25 NOTE — Telephone Encounter (Signed)
I left a message on the number(s) listed in the patients chart requesting the patient to call back regarding the upcomming appointment for 09/24/2022. The provider has some training classes at 11 on 09/24/2022. The appointment has been canceled. Waiting for the patient to return the call.

## 2022-09-24 ENCOUNTER — Ambulatory Visit: Payer: Commercial Managed Care - HMO | Admitting: Physician Assistant

## 2022-09-25 ENCOUNTER — Encounter: Payer: Self-pay | Admitting: Physician Assistant

## 2022-09-25 ENCOUNTER — Ambulatory Visit (INDEPENDENT_AMBULATORY_CARE_PROVIDER_SITE_OTHER): Payer: HMO | Admitting: Physician Assistant

## 2022-09-25 VITALS — BP 136/82 | HR 85 | Temp 97.1°F | Ht 65.0 in | Wt 227.0 lb

## 2022-09-25 DIAGNOSIS — R000 Tachycardia, unspecified: Secondary | ICD-10-CM

## 2022-09-25 DIAGNOSIS — R7303 Prediabetes: Secondary | ICD-10-CM | POA: Diagnosis not present

## 2022-09-25 DIAGNOSIS — E039 Hypothyroidism, unspecified: Secondary | ICD-10-CM | POA: Diagnosis not present

## 2022-09-25 MED ORDER — TORSEMIDE 5 MG PO TABS: 5.0000 mg | ORAL_TABLET | Freq: Every day | ORAL | 3 refills | Status: DC

## 2022-09-25 MED ORDER — METOPROLOL SUCCINATE ER 25 MG PO TB24: 25.0000 mg | ORAL_TABLET | Freq: Every day | ORAL | 3 refills | Status: DC

## 2022-09-25 MED ORDER — LEVOTHYROXINE SODIUM 50 MCG PO TABS: 50.0000 ug | ORAL_TABLET | Freq: Every day | ORAL | 1 refills | Status: DC

## 2022-09-25 NOTE — Progress Notes (Signed)
Subjective:  Patient ID: Sonya Francis, female    DOB: 04-17-1965  Age: 57 y.o. MRN: [id number]  Chief Complaint  Patient presents with   Medical Management of Chronic Issues    HPI  Denies any major issues or questions today. Her last appointment was back in December about 6 months ago. Wants to continue having a follow up every 6 months. Discussed with her warning signs of an abnormal Thyroid. Patient requested multiple medication refills today as well.    Prediabetes: A1C 5.5  HTN: Metoprolol 25 mg daily and Torsemide 5 mg daily  Thyroid: Synthroid 50 mcg  Declined colonoscopy or cologuard, Dtap vaccine.     09/25/2022   11:01 AM 12/26/2021   10:04 AM 06/24/2021    9:08 AM 05/10/2020    2:29 PM  Depression screen PHQ 2/9  Decreased Interest 0 0 0 3  Down, Depressed, Hopeless 0 0 0 0  PHQ - 2 Score 0 0 0 3  Altered sleeping 0 0  0  Tired, decreased energy 3 0  3  Change in appetite 0 0  0  Feeling bad or failure about yourself  0 0  0  Trouble concentrating 0 0  0  Moving slowly or fidgety/restless 0 0  0  Suicidal thoughts 0 0  0  PHQ-9 Score 3 0  6  Difficult doing work/chores Not difficult at all Not difficult at all  Not difficult at all        09/25/2022   11:00 AM  Fall Risk   Falls in the past year? 0  Number falls in past yr: 0  Injury with Fall? 0  Risk for fall due to : No Fall Risks  Follow up Falls evaluation completed    Patient Care Team: Janie Morning, NP (Inactive) as PCP - General (Nurse Practitioner)   Review of Systems  Constitutional:  Negative for chills, fatigue and fever.  HENT:  Negative for congestion, ear pain, rhinorrhea and sore throat.   Respiratory:  Negative for cough and shortness of breath.   Cardiovascular:  Negative for chest pain.  Gastrointestinal:  Negative for abdominal pain, constipation, diarrhea, nausea and vomiting.  Genitourinary:  Negative for dysuria and urgency.  Musculoskeletal:  Negative for back pain and  myalgias.  Neurological:  Negative for dizziness, weakness, light-headedness and headaches.  Psychiatric/Behavioral:  Negative for dysphoric mood. The patient is not nervous/anxious.     Current Outpatient Medications on File Prior to Visit  Medication Sig Dispense Refill   albuterol (VENTOLIN HFA) 108 (90 Base) MCG/ACT inhaler Inhale 2 puffs into the lungs every 4 (four) hours as needed for wheezing.     fluticasone (FLONASE) 50 MCG/ACT nasal spray Place 1 spray into both nostrils daily.     loratadine (CLARITIN) 10 MG tablet TAKE 1 TABLET BY MOUTH EVERY DAY 90 tablet 0   Multiple Vitamins-Minerals (ONE-A-DAY WOMENS PO) Take by mouth. With vitamin d     No current facility-administered medications on file prior to visit.   Past Medical History:  Diagnosis Date   Elevated TSH    Tendonitis of ankle or foot 08/13/2015   Past Surgical History:  Procedure Laterality Date   NONE      Family History  Problem Relation Age of Onset   Cancer Mother    Cancer Father    Social History   Socioeconomic History   Marital status: Married    Spouse name: Not on file   Number of children: Not  on file   Years of education: Not on file   Highest education level: Not on file  Occupational History   Not on file  Tobacco Use   Smoking status: Never   Smokeless tobacco: Never  Vaping Use   Vaping Use: Never used  Substance and Sexual Activity   Alcohol use: Never   Drug use: Never   Sexual activity: Not on file  Other Topics Concern   Not on file  Social History Narrative   Not on file   Social Determinants of Health   Financial Resource Strain: Low Risk  (12/26/2021)   Overall Financial Resource Strain (CARDIA)    Difficulty of Paying Living Expenses: Not hard at all  Food Insecurity: No Food Insecurity (12/26/2021)   Hunger Vital Sign    Worried About Running Out of Food in the Last Year: Never true    Ran Out of Food in the Last Year: Never true  Transportation Needs: No  Transportation Needs (12/26/2021)   PRAPARE - Administrator, Civil Service (Medical): No    Lack of Transportation (Non-Medical): No  Physical Activity: Inactive (09/25/2022)   Exercise Vital Sign    Days of Exercise per Week: 0 days    Minutes of Exercise per Session: 0 min  Stress: No Stress Concern Present (12/26/2021)   [employer] of Occupational Health - Occupational Stress Questionnaire    Feeling of Stress : Not at all  Social Connections: Moderately Isolated (12/26/2021)   Social Connection and Isolation Panel [NHANES]    Frequency of Communication with Friends and Family: More than three times a week    Frequency of Social Gatherings with Friends and Family: More than three times a week    Attends Religious Services: Never    Database administrator or Organizations: No    Attends Engineer, structural: Never    Marital Status: Married    Objective:  BP 136/82   Pulse 85   Temp (!) 97.1 F (36.2 C)   Ht 5\' 5"  (1.651 m)   Wt 227 lb (103 kg)   SpO2 93%   BMI 37.77 kg/m      09/25/2022   11:00 AM 12/26/2021   10:03 AM 08/06/2021    9:22 AM  BP/Weight  Systolic BP 136 128 140  Diastolic BP 82 74 82  Wt. (Lbs) 227 215 226  BMI 37.77 kg/m2 35.78 kg/m2 34.36 kg/m2    Physical Exam Vitals reviewed.  Constitutional:      Appearance: Normal appearance. She is normal weight.  Cardiovascular:     Rate and Rhythm: Normal rate and regular rhythm.     Heart sounds: Normal heart sounds.  Pulmonary:     Effort: Pulmonary effort is normal. No respiratory distress.     Breath sounds: Normal breath sounds.  Abdominal:     General: Abdomen is flat. Bowel sounds are normal.     Palpations: Abdomen is soft.     Tenderness: There is no abdominal tenderness.  Neurological:     Mental Status: She is alert and oriented to person, place, and time.  Psychiatric:        Mood and Affect: Mood normal.        Behavior: Behavior normal.     Diabetic Foot  Exam - Simple   No data filed      Lab Results  Component Value Date   WBC 7.9 12/26/2021   HGB 14.1 12/26/2021   HCT 41.5  12/26/2021   PLT 327 12/26/2021   GLUCOSE 104 (H) 12/26/2021   CHOL 180 12/26/2021   TRIG 105 12/26/2021   HDL 46 12/26/2021   LDLCALC 115 (H) 12/26/2021   ALT 24 12/26/2021   AST 18 12/26/2021   NA 143 12/26/2021   K 5.1 12/26/2021   CL 103 12/26/2021   CREATININE 0.81 12/26/2021   BUN 14 12/26/2021   CO2 24 12/26/2021   TSH 1.740 12/26/2021   HGBA1C 5.5 12/26/2021      Assessment & Plan:    Hypothyroidism (acquired) Assessment & Plan: Well controlled.  Continue to work on eating a healthy diet and exercise.  Labs drawn today.   No major side effects reported, and no issues with compliance. The current medical regimen is effective;  continue present plan with Synthroid Will adjust medication as needed depending on labs    Orders: -     Levothyroxine Sodium; Take 1 tablet (50 mcg total) by mouth daily.  Dispense: 90 tablet; Refill: 1 -     Comprehensive metabolic panel -     Lipid panel -     T4, free -     TSH  Prediabetes Assessment & Plan: Controlled Focusing on diet and exercise to manage Prediabetes Continue monitoring diet and exercise Labs drawn today Will add medication if needed  Orders: -     CBC with Differential/Platelet -     Hemoglobin A1c  Tachycardia -     Metoprolol Succinate ER; Take 1 tablet (25 mg total) by mouth daily.  Dispense: 90 tablet; Refill: 3  Other orders -     Torsemide; Take 1 tablet (5 mg total) by mouth daily.  Dispense: 90 tablet; Refill: 3     Meds ordered this encounter  Medications   levothyroxine (SYNTHROID) 50 MCG tablet    Sig: Take 1 tablet (50 mcg total) by mouth daily.    Dispense:  90 tablet    Refill:  1   metoprolol succinate (TOPROL-XL) 25 MG 24 hr tablet    Sig: Take 1 tablet (25 mg total) by mouth daily.    Dispense:  90 tablet    Refill:  3   torsemide  (DEMADEX) 5 MG tablet    Sig: Take 1 tablet (5 mg total) by mouth daily.    Dispense:  90 tablet    Refill:  3    Orders Placed This Encounter  Procedures   CBC with Differential/Platelet   Comprehensive metabolic panel   Lipid panel   Hemoglobin A1c   T4, free   TSH     Follow-up: Return in about 3 months (around 12/26/2022) for chronic, fasting, Chronic, Huston Foley.   I,Katherina A Bramblett,acting as a scribe for [employer], PA.,have documented all relevant documentation on the behalf of Langley Gauss, PA,as directed by  Langley Gauss, PA while in the presence of Langley Gauss, [place].   An After Visit Summary was printed and given to the patient.  Langley Gauss, [place] [facility] [phone]

## 2022-09-25 NOTE — Assessment & Plan Note (Signed)
Well controlled.  Continue to work on eating a healthy diet and exercise.  Labs drawn today.   No major side effects reported, and no issues with compliance. The current medical regimen is effective;  continue present plan with Synthroid Will adjust medication as needed depending on labs

## 2022-09-25 NOTE — Assessment & Plan Note (Signed)
Controlled Focusing on diet and exercise to manage Prediabetes Continue monitoring diet and exercise Labs drawn today Will add medication if needed

## 2022-09-26 LAB — CBC WITH DIFFERENTIAL/PLATELET
Basophils Absolute: 0 10*3/uL (ref 0.0–0.2)
Basos: 0 %
EOS (ABSOLUTE): 0.1 10*3/uL (ref 0.0–0.4)
Eos: 1 %
Hematocrit: 43.4 % (ref 34.0–46.6)
Hemoglobin: 14 g/dL (ref 11.1–15.9)
Immature Grans (Abs): 0 10*3/uL (ref 0.0–0.1)
Immature Granulocytes: 0 %
Lymphocytes Absolute: 2.4 10*3/uL (ref 0.7–3.1)
Lymphs: 35 %
MCH: 29.7 pg (ref 26.6–33.0)
MCHC: 32.3 g/dL (ref 31.5–35.7)
MCV: 92 fL (ref 79–97)
Monocytes Absolute: 0.5 10*3/uL (ref 0.1–0.9)
Monocytes: 7 %
Neutrophils Absolute: 3.9 10*3/uL (ref 1.4–7.0)
Neutrophils: 57 %
Platelets: 304 10*3/uL (ref 150–450)
RBC: 4.72 x10E6/uL (ref 3.77–5.28)
RDW: 13.1 % (ref 11.7–15.4)
WBC: 7 10*3/uL (ref 3.4–10.8)

## 2022-09-26 LAB — HEMOGLOBIN A1C
Est. average glucose Bld gHb Est-mCnc: 114 mg/dL
Hgb A1c MFr Bld: 5.6 % (ref 4.8–5.6)

## 2022-09-26 LAB — COMPREHENSIVE METABOLIC PANEL
ALT: 37 IU/L — ABNORMAL HIGH (ref 0–32)
AST: 25 IU/L (ref 0–40)
Albumin: 4.2 g/dL (ref 3.8–4.9)
Alkaline Phosphatase: 150 IU/L — ABNORMAL HIGH (ref 44–121)
BUN/Creatinine Ratio: 13 (ref 9–23)
BUN: 11 mg/dL (ref 6–24)
Bilirubin Total: 0.4 mg/dL (ref 0.0–1.2)
CO2: 24 mmol/L (ref 20–29)
Calcium: 9.5 mg/dL (ref 8.7–10.2)
Chloride: 105 mmol/L (ref 96–106)
Creatinine, Ser: 0.84 mg/dL (ref 0.57–1.00)
Globulin, Total: 2.2 g/dL (ref 1.5–4.5)
Glucose: 98 mg/dL (ref 70–99)
Potassium: 5 mmol/L (ref 3.5–5.2)
Sodium: 142 mmol/L (ref 134–144)
Total Protein: 6.4 g/dL (ref 6.0–8.5)
eGFR: 80 mL/min/{1.73_m2} (ref >59)

## 2022-09-26 LAB — LIPID PANEL
Chol/HDL Ratio: 3.5 ratio (ref 0.0–4.4)
Cholesterol, Total: 171 mg/dL (ref 100–199)
HDL: 49 mg/dL (ref >39)
LDL Chol Calc (NIH): 103 mg/dL — ABNORMAL HIGH (ref 0–99)
Triglycerides: 105 mg/dL (ref 0–149)
VLDL Cholesterol Cal: 19 mg/dL (ref 5–40)

## 2022-09-26 LAB — T4, FREE: Free T4: 1.35 ng/dL (ref 0.82–1.77)

## 2022-09-26 LAB — TSH: TSH: 2.59 u[IU]/mL (ref 0.450–4.500)

## 2022-12-28 ENCOUNTER — Encounter: Payer: Self-pay | Admitting: Physician Assistant

## 2022-12-28 NOTE — Progress Notes (Unsigned)
Subjective:  Patient ID: Sonya Francis, female    DOB: 06/29/1965  Age: 57 y.o. MRN: [id number]  Chief Complaint  Patient presents with   Medical Management of Chronic Issues    HPI     Prediabetes: A1C 5.6%  HTN: Metoprolol 25 mg daily and Torsemide 5 mg daily  Thyroid: Synthroid 50 mcg  Back pain in the lower area with radiation since 3 years ago. It is 10/10 with she is doing ADLs and gets better when she rest. Patient states that heat has made it worse. Patient denies any acute traumas. States that over the past month it has gotten considerably worse. Patient states that sitting and resting is good for it, but that when she gets up to start to do something. Patient states that she has tried Ibuprofen and Tylenol without any success. States that it only bothers her when she is standing, sweeping, or being very active. States the pain does not radiate down her legs. Discussed doing some physical therapy to see if that would help with symptom improvement and if not ordering an MRI along with a referral to a back orthopedic.        12/29/2022   11:04 AM 09/25/2022   11:01 AM 12/26/2021   10:04 AM 06/24/2021    9:08 AM 05/10/2020    2:29 PM  Depression screen PHQ 2/9  Decreased Interest 3 0 0 0 3  Down, Depressed, Hopeless 0 0 0 0 0  PHQ - 2 Score 3 0 0 0 3  Altered sleeping 0 0 0  0  Tired, decreased energy 3 3 0  3  Change in appetite 0 0 0  0  Feeling bad or failure about yourself  0 0 0  0  Trouble concentrating 0 0 0  0  Moving slowly or fidgety/restless 0 0 0  0  Suicidal thoughts 0 0 0  0  PHQ-9 Score 6 3 0  6  Difficult doing work/chores Not difficult at all Not difficult at all Not difficult at all  Not difficult at all        12/29/2022   11:04 AM  Fall Risk   Falls in the past year? 0  Number falls in past yr: 0  Injury with Fall? 0  Risk for fall due to : No Fall Risks  Follow up Education provided    Patient Care Team: Langley Gauss, [place] as PCP - General  (Physician Assistant)   Review of Systems  Constitutional:  Negative for chills, fatigue and fever.  HENT:  Negative for congestion, ear pain and sore throat.   Respiratory:  Negative for cough and shortness of breath.   Cardiovascular:  Negative for chest pain and palpitations.  Gastrointestinal:  Negative for abdominal pain, constipation, diarrhea, nausea and vomiting.  Endocrine: Negative for polydipsia, polyphagia and polyuria.  Genitourinary:  Negative for difficulty urinating and dysuria.  Musculoskeletal:  Positive for back pain (lower back pain). Negative for arthralgias and myalgias.  Skin:  Negative for rash.  Neurological:  Negative for headaches.  Psychiatric/Behavioral:  Negative for dysphoric mood. The patient is not nervous/anxious.     Current Outpatient Medications on File Prior to Visit  Medication Sig Dispense Refill   fluticasone (FLONASE) 50 MCG/ACT nasal spray Place 1 spray into both nostrils daily.     levothyroxine (SYNTHROID) 50 MCG tablet Take 1 tablet (50 mcg total) by mouth daily. 90 tablet 1   loratadine (CLARITIN) 10 MG tablet TAKE 1 TABLET BY  MOUTH EVERY DAY 90 tablet 0   metoprolol succinate (TOPROL-XL) 25 MG 24 hr tablet Take 1 tablet (25 mg total) by mouth daily. 90 tablet 3   Multiple Vitamins-Minerals (ONE-A-DAY WOMENS PO) Take by mouth. With vitamin d     torsemide (DEMADEX) 5 MG tablet Take 1 tablet (5 mg total) by mouth daily. 90 tablet 3   No current facility-administered medications on file prior to visit.   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   Elevated TSH    Tendonitis of ankle or foot 08/13/2015   Past Surgical History:  Procedure Laterality Date   CHOLECYSTECTOMY  09/2021    Family History  Problem Relation Age of Onset   Cancer Mother    Cancer Father    Social History   Socioeconomic History   Marital status: Married    Spouse name: Not on file   Number of children: Not on file   Years  of education: Not on file   Highest education level: Not on file  Occupational History   Not on file  Tobacco Use   Smoking status: Never   Smokeless tobacco: Never  Vaping Use   Vaping status: Never Used  Substance and Sexual Activity   Alcohol use: Never   Drug use: Never   Sexual activity: Yes    Partners: Male  Other Topics Concern   Not on file  Social History Narrative   Not on file   Social Determinants of Health   Financial Resource Strain: Low Risk  (12/26/2021)   Overall Financial Resource Strain (CARDIA)    Difficulty of Paying Living Expenses: Not hard at all  Food Insecurity: No Food Insecurity (12/26/2021)   Hunger Vital Sign    Worried About Running Out of Food in the Last Year: Never true    Ran Out of Food in the Last Year: Never true  Transportation Needs: No Transportation Needs (12/26/2021)   PRAPARE - Administrator, Civil Service (Medical): No    Lack of Transportation (Non-Medical): No  Physical Activity: Inactive (09/25/2022)   Exercise Vital Sign    Days of Exercise per Week: 0 days    Minutes of Exercise per Session: 0 min  Stress: No Stress Concern Present (12/26/2021)   [employer] of Occupational Health - Occupational Stress Questionnaire    Feeling of Stress : Not at all  Social Connections: Moderately Isolated (12/26/2021)   Social Connection and Isolation Panel [NHANES]    Frequency of Communication with Friends and Family: More than three times a week    Frequency of Social Gatherings with Friends and Family: More than three times a week    Attends Religious Services: Never    Database administrator or Organizations: No    Attends Engineer, structural: Never    Marital Status: Married    Objective:  BP 120/70   Pulse 77   Temp (!) 97.5 F (36.4 C)   Resp 14   Ht 5\' 5"  (1.651 m)   Wt 230 lb (104.3 kg)   LMP  (LMP Unknown)   SpO2 94%   BMI 38.27 kg/m      12/29/2022   10:40 AM 09/25/2022   11:00 AM  12/26/2021   10:03 AM  BP/Weight  Systolic BP 120 136 128  Diastolic BP 70 82 74  Wt. (Lbs) 230 227 215  BMI 38.27 kg/m2 37.77 kg/m2 35.78 kg/m2    Physical Exam Vitals  reviewed.  Constitutional:      Appearance: Normal appearance.  Cardiovascular:     Rate and Rhythm: Normal rate and regular rhythm.     Heart sounds: Normal heart sounds.  Pulmonary:     Effort: Pulmonary effort is normal.     Breath sounds: Normal breath sounds.  Abdominal:     General: Bowel sounds are normal.     Palpations: Abdomen is soft.     Tenderness: There is no abdominal tenderness.  Neurological:     Mental Status: She is alert and oriented to person, place, and time.  Psychiatric:        Mood and Affect: Mood normal.        Behavior: Behavior normal.     Diabetic Foot Exam - Simple   No data filed      Lab Results  Component Value Date   WBC 7.0 09/25/2022   HGB 14.0 09/25/2022   HCT 43.4 09/25/2022   PLT 304 09/25/2022   GLUCOSE 98 09/25/2022   CHOL 171 09/25/2022   TRIG 105 09/25/2022   HDL 49 09/25/2022   LDLCALC 103 (H) 09/25/2022   ALT 37 (H) 09/25/2022   AST 25 09/25/2022   NA 142 09/25/2022   K 5.0 09/25/2022   CL 105 09/25/2022   CREATININE 0.84 09/25/2022   BUN 11 09/25/2022   CO2 24 09/25/2022   TSH 2.590 09/25/2022   HGBA1C 5.6 09/25/2022      Assessment & Plan:    Hypothyroidism (acquired) -     T4, free -     TSH  Prediabetes Assessment & Plan: Controlled with diet and exercise Labs drawn today Will adjust treatment as needed  Orders: -     CBC with Differential/Platelet -     Comprehensive metabolic panel -     Hemoglobin A1c  Mixed hyperlipidemia Assessment & Plan: Well controlled.  Continue to work on eating a healthy diet and exercise.  Labs drawn today.   Continue working on diet and exercise Will adjust medication as needed depending on labs Lab Results  Component Value Date   LDLCALC 103 (H) 09/25/2022     Orders: -     Lipid  panel  Chronic midline low back pain without sciatica Assessment & Plan: Referral sent for PT Will try PT for 3 weeks to see if there is improvement Will send for MRI and referral to orthopedic back specialist  Orders: -     Ambulatory referral to Physical Therapy  Screening mammogram for breast cancer -     3D Screening Mammogram, Left and Right; Future     No orders of the defined types were placed in this encounter.   Orders Placed This Encounter  Procedures   MM 3D SCREENING MAMMOGRAM BILATERAL BREAST   CBC with Differential/Platelet   Comprehensive metabolic panel   Hemoglobin A1c   Lipid panel   T4, free   TSH   Ambulatory referral to Physical Therapy     Follow-up: Return in about 3 months (around 03/30/2023) for Chronic, Huston Foley.   I,Marla I Leal-Borjas,acting as a scribe for [employer], PA.,have documented all relevant documentation on the behalf of Langley Gauss, PA,as directed by  Langley Gauss, PA while in the presence of Langley Gauss, [place].   An After Visit Summary was printed and given to the patient.  Langley Gauss, [place] [facility] [phone]

## 2022-12-29 ENCOUNTER — Encounter: Payer: Self-pay | Admitting: Physician Assistant

## 2022-12-29 ENCOUNTER — Ambulatory Visit: Payer: HMO | Admitting: Physician Assistant

## 2022-12-29 VITALS — BP 120/70 | HR 77 | Temp 97.5°F | Resp 14 | Ht 65.0 in | Wt 230.0 lb

## 2022-12-29 DIAGNOSIS — R7303 Prediabetes: Secondary | ICD-10-CM | POA: Diagnosis not present

## 2022-12-29 DIAGNOSIS — G8929 Other chronic pain: Secondary | ICD-10-CM

## 2022-12-29 DIAGNOSIS — E039 Hypothyroidism, unspecified: Secondary | ICD-10-CM

## 2022-12-29 DIAGNOSIS — Z1231 Encounter for screening mammogram for malignant neoplasm of breast: Secondary | ICD-10-CM | POA: Diagnosis not present

## 2022-12-29 DIAGNOSIS — E782 Mixed hyperlipidemia: Secondary | ICD-10-CM | POA: Diagnosis not present

## 2022-12-29 DIAGNOSIS — M545 Low back pain, unspecified: Secondary | ICD-10-CM | POA: Diagnosis not present

## 2022-12-29 NOTE — Assessment & Plan Note (Signed)
Referral sent for PT Will try PT for 3 weeks to see if there is improvement Will send for MRI and referral to orthopedic back specialist

## 2022-12-29 NOTE — Assessment & Plan Note (Signed)
Well controlled.  Continue to work on eating a healthy diet and exercise.  Labs drawn today.   Continue working on diet and exercise Will adjust medication as needed depending on labs Lab Results  Component Value Date   LDLCALC 103 (H) 09/25/2022

## 2022-12-29 NOTE — Assessment & Plan Note (Signed)
Controlled with diet and exercise Labs drawn today Will adjust treatment as needed

## 2022-12-30 LAB — COMPREHENSIVE METABOLIC PANEL
ALT: 49 IU/L — ABNORMAL HIGH (ref 0–32)
AST: 33 IU/L (ref 0–40)
Albumin: 4.2 g/dL (ref 3.8–4.9)
Alkaline Phosphatase: 138 IU/L — ABNORMAL HIGH (ref 44–121)
BUN/Creatinine Ratio: 15 (ref 9–23)
BUN: 14 mg/dL (ref 6–24)
Bilirubin Total: 0.3 mg/dL (ref 0.0–1.2)
CO2: 22 mmol/L (ref 20–29)
Calcium: 9.4 mg/dL (ref 8.7–10.2)
Chloride: 106 mmol/L (ref 96–106)
Creatinine, Ser: 0.91 mg/dL (ref 0.57–1.00)
Globulin, Total: 2.3 g/dL (ref 1.5–4.5)
Glucose: 107 mg/dL — ABNORMAL HIGH (ref 70–99)
Potassium: 4.9 mmol/L (ref 3.5–5.2)
Sodium: 143 mmol/L (ref 134–144)
Total Protein: 6.5 g/dL (ref 6.0–8.5)
eGFR: 73 mL/min/{1.73_m2} (ref >59)

## 2022-12-30 LAB — CBC WITH DIFFERENTIAL/PLATELET
Basophils Absolute: 0 10*3/uL (ref 0.0–0.2)
Basos: 0 %
EOS (ABSOLUTE): 0.1 10*3/uL (ref 0.0–0.4)
Eos: 1 %
Hematocrit: 43.6 % (ref 34.0–46.6)
Hemoglobin: 14.4 g/dL (ref 11.1–15.9)
Immature Grans (Abs): 0 10*3/uL (ref 0.0–0.1)
Immature Granulocytes: 0 %
Lymphocytes Absolute: 2.5 10*3/uL (ref 0.7–3.1)
Lymphs: 36 %
MCH: 30.4 pg (ref 26.6–33.0)
MCHC: 33 g/dL (ref 31.5–35.7)
MCV: 92 fL (ref 79–97)
Monocytes Absolute: 0.6 10*3/uL (ref 0.1–0.9)
Monocytes: 8 %
Neutrophils Absolute: 3.8 10*3/uL (ref 1.4–7.0)
Neutrophils: 55 %
Platelets: 311 10*3/uL (ref 150–450)
RBC: 4.74 x10E6/uL (ref 3.77–5.28)
RDW: 12.3 % (ref 11.7–15.4)
WBC: 7 10*3/uL (ref 3.4–10.8)

## 2022-12-30 LAB — TSH: TSH: 2.4 u[IU]/mL (ref 0.450–4.500)

## 2022-12-30 LAB — HEMOGLOBIN A1C
Est. average glucose Bld gHb Est-mCnc: 117 mg/dL
Hgb A1c MFr Bld: 5.7 % — ABNORMAL HIGH (ref 4.8–5.6)

## 2022-12-30 LAB — LIPID PANEL
Chol/HDL Ratio: 3.6 ratio (ref 0.0–4.4)
Cholesterol, Total: 168 mg/dL (ref 100–199)
HDL: 47 mg/dL (ref >39)
LDL Chol Calc (NIH): 104 mg/dL — ABNORMAL HIGH (ref 0–99)
Triglycerides: 91 mg/dL (ref 0–149)
VLDL Cholesterol Cal: 17 mg/dL (ref 5–40)

## 2022-12-30 LAB — T4, FREE: Free T4: 1.33 ng/dL (ref 0.82–1.77)

## 2022-12-30 IMAGING — MG MM BREAST BX W/ LOC DEV 1ST LESION IMAGE BX SPEC STEREO GUIDE*R*
8 of 19 series · 8 of 40 positions shown · non-contrast
Comparison: None Available.
COMPARISON: None Available.

Addendum:
CLINICAL DATA: Biopsy right breast asymmetry in the upper outer
quadrant.

EXAM:
RIGHT BREAST STEREOTACTIC CORE NEEDLE BIOPSY

[R (1 of 8)]
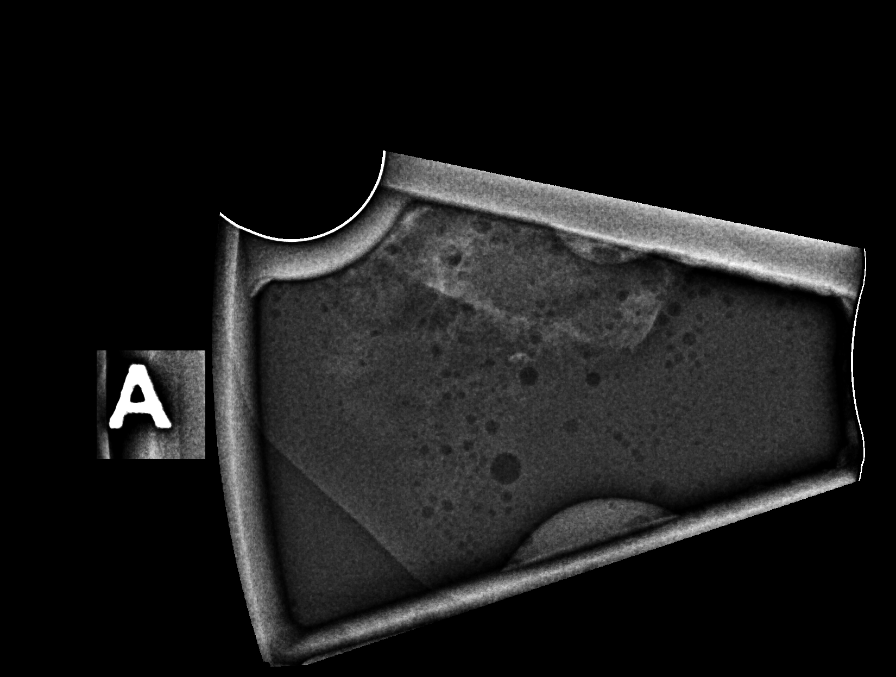

[R (2 of 8)]
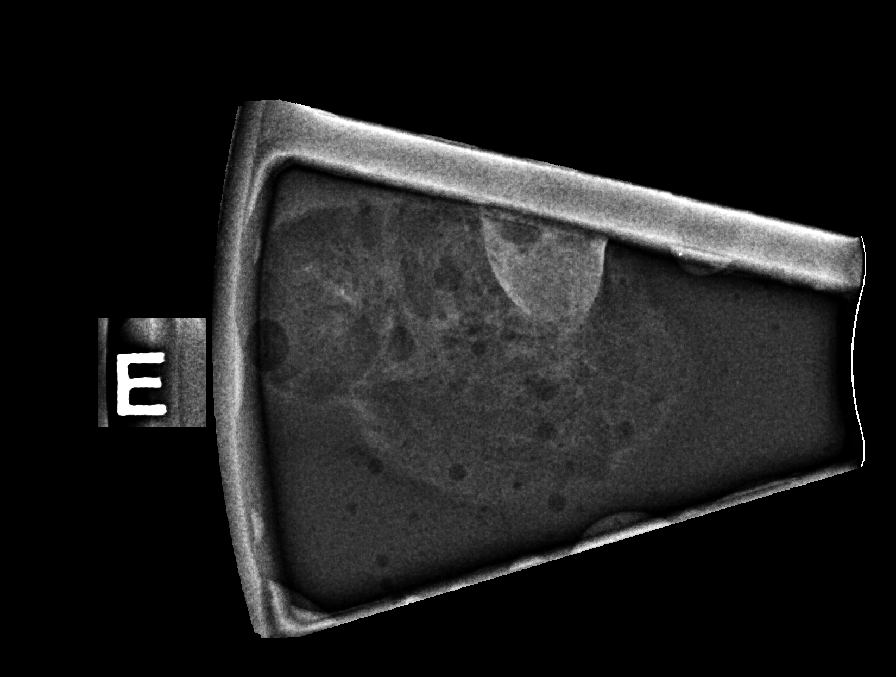

[R (3 of 8)]
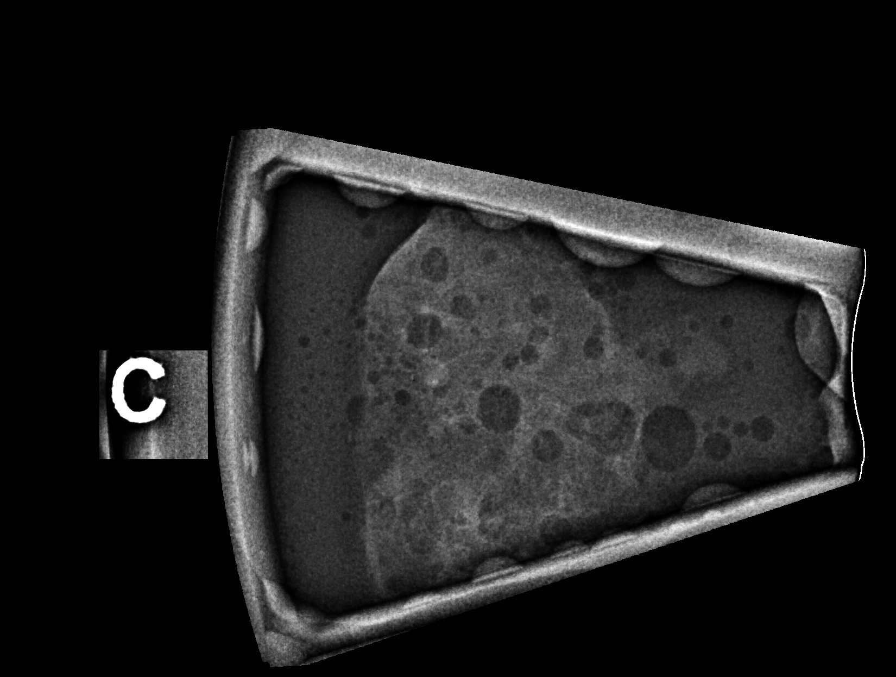

[R (4 of 8)]
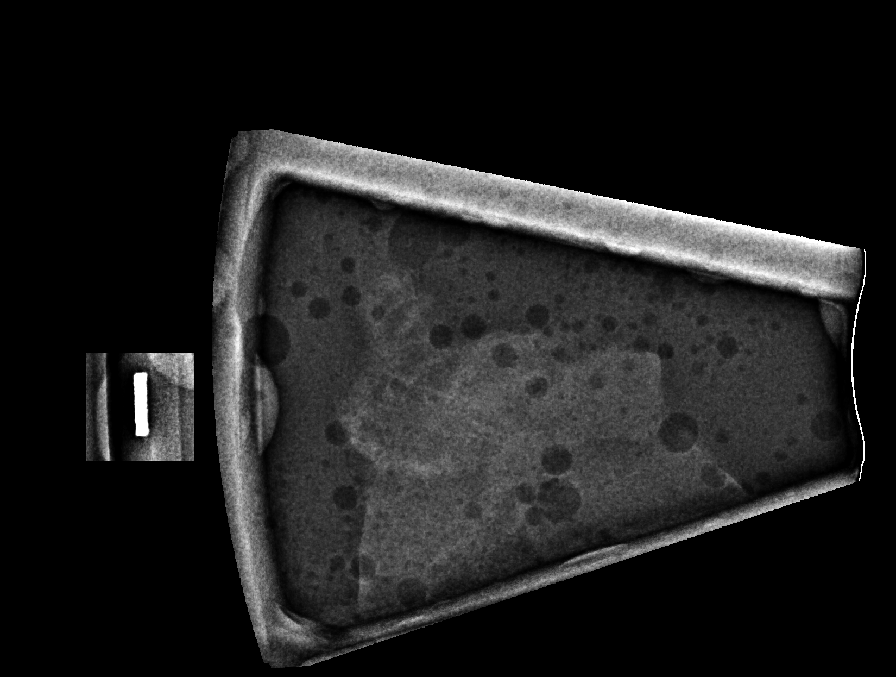

[R (5 of 8)]
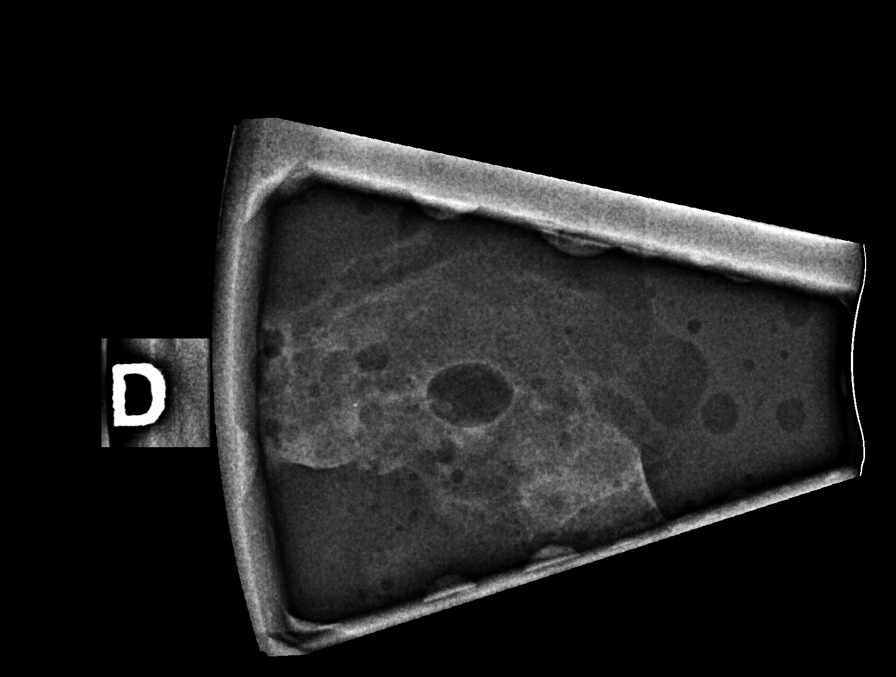

[R (6 of 8)]
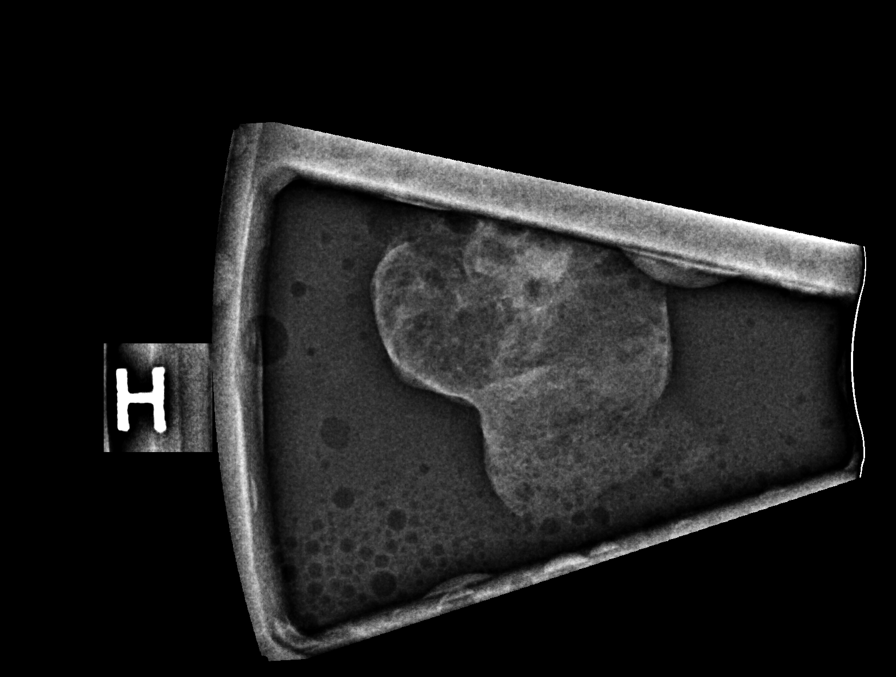

[R (7 of 8)]
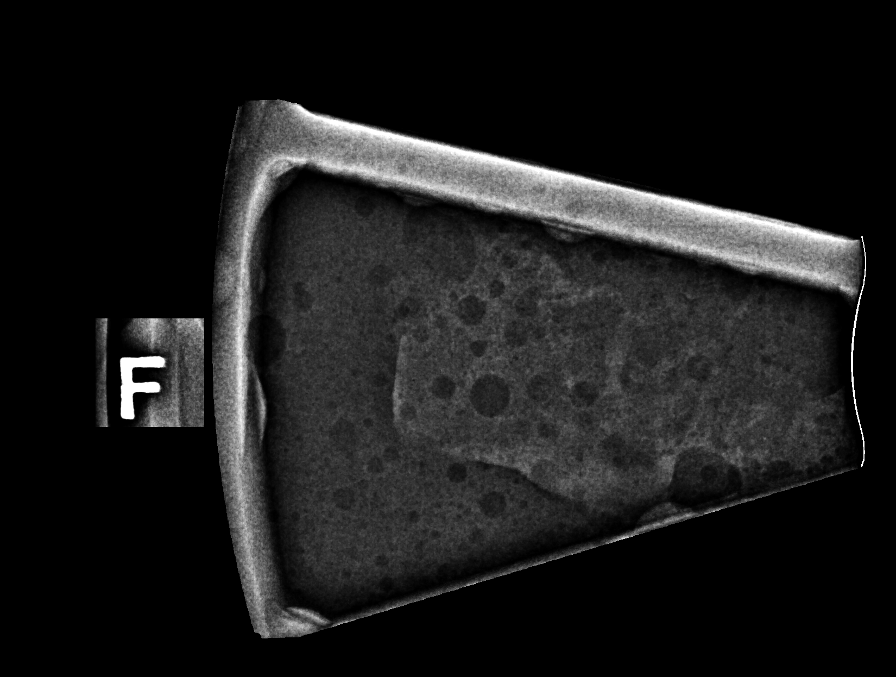

[R (8 of 8)]
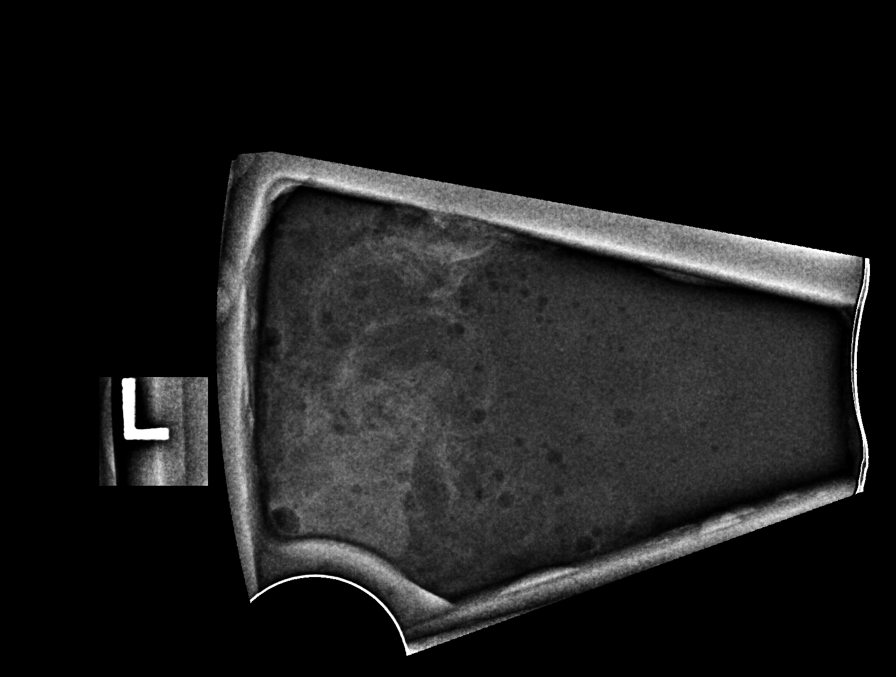

[8 of 40 positions shown; findings below may reference images not displayed]



Using sterile technique and 1% Lidocaine as local anesthetic, under
stereotactic guidance, a 9 gauge vacuum assisted device was used to
perform core needle biopsy of an asymmetry in the upper outer
quadrant of the right breast using a superior approach.

Lesion quadrant: Upper outer right breast

At the conclusion of the procedure, a tissue marker clip was
deployed into the biopsy cavity. Follow-up 2-view mammogram was
performed and dictated separately.
IMPRESSION: Stereotactic-guided biopsy of an asymmetry in the upper outer right
breast. No apparent complications.

ADDENDUM:
Pathology revealed FIBROCYSTIC CHANGES AND FIBROSCLEROSIS WITH FOCAL
USUAL EPITHELIAL HYPERPLASIA AND FOCAL INTRALUMINAL
MICROCALCIFICATIONS of the RIGHT breast, upper outer, (ribbon clip).
This was found to be concordant by Dr. Mogakabe Nabe.

Pathology results were discussed with the patient by telephone. The
patient reported doing well after the biopsy with tenderness at the
site. Post biopsy instructions and care were reviewed and questions
were answered. The patient was encouraged to call The [REDACTED] for any additional concerns. My direct phone
number was provided.

The patient was instructed to return for annual screening
mammography at [REDACTED] in [HOSPITAL][HOSPITAL].

Pathology results reported by Islami, RN on 09/06/2021.



Using sterile technique and 1% Lidocaine as local anesthetic, under
stereotactic guidance, a 9 gauge vacuum assisted device was used to
perform core needle biopsy of an asymmetry in the upper outer
quadrant of the right breast using a superior approach.

Lesion quadrant: Upper outer right breast

At the conclusion of the procedure, a tissue marker clip was
deployed into the biopsy cavity. Follow-up 2-view mammogram was
performed and dictated separately.
IMPRESSION: Stereotactic-guided biopsy of an asymmetry in the upper outer right
breast. No apparent complications.

## 2022-12-31 ENCOUNTER — Other Ambulatory Visit: Payer: Self-pay | Admitting: Physician Assistant

## 2022-12-31 DIAGNOSIS — R899 Unspecified abnormal finding in specimens from other organs, systems and tissues: Secondary | ICD-10-CM

## 2023-01-01 ENCOUNTER — Telehealth: Payer: Self-pay | Admitting: Physician Assistant

## 2023-01-01 NOTE — Telephone Encounter (Signed)
   Sonya Francis has been scheduled for the following appointment:  WHAT: GB/RUQ ULTRASOUND WHERE: [facility] DATE: 01/06/23 TIME: 10:00 AM CHECK-IN  Patient has been made aware.

## 2023-01-06 DIAGNOSIS — Z9049 Acquired absence of other specified parts of digestive tract: Secondary | ICD-10-CM | POA: Diagnosis not present

## 2023-01-06 DIAGNOSIS — R899 Unspecified abnormal finding in specimens from other organs, systems and tissues: Secondary | ICD-10-CM | POA: Diagnosis not present

## 2023-01-06 DIAGNOSIS — R7401 Elevation of levels of liver transaminase levels: Secondary | ICD-10-CM | POA: Diagnosis not present

## 2023-01-06 DIAGNOSIS — R7989 Other specified abnormal findings of blood chemistry: Secondary | ICD-10-CM | POA: Diagnosis not present

## 2023-01-12 ENCOUNTER — Telehealth: Payer: Self-pay | Admitting: Physician Assistant

## 2023-01-12 ENCOUNTER — Other Ambulatory Visit: Payer: Self-pay | Admitting: Physician Assistant

## 2023-01-12 NOTE — Telephone Encounter (Signed)
   Sonya Francis has been scheduled for the following appointment:  WHAT: MAMMOGRAM WHERE: [facility] DATE: 01/16/2023 TIME: 1:00 PM CHECK-IN  Patient has been made aware.

## 2023-01-16 DIAGNOSIS — Z1231 Encounter for screening mammogram for malignant neoplasm of breast: Secondary | ICD-10-CM | POA: Diagnosis not present

## 2023-01-16 LAB — HM MAMMOGRAPHY

## 2023-01-20 ENCOUNTER — Encounter: Payer: Self-pay | Admitting: Physician Assistant

## 2023-04-02 ENCOUNTER — Ambulatory Visit: Payer: HMO | Admitting: Physician Assistant

## 2023-04-02 ENCOUNTER — Encounter: Payer: Self-pay | Admitting: Physician Assistant

## 2023-04-02 VITALS — BP 120/70 | HR 87 | Temp 97.4°F | Resp 14 | Ht 65.0 in | Wt 229.0 lb

## 2023-04-02 DIAGNOSIS — R748 Abnormal levels of other serum enzymes: Secondary | ICD-10-CM | POA: Diagnosis not present

## 2023-04-02 DIAGNOSIS — E039 Hypothyroidism, unspecified: Secondary | ICD-10-CM

## 2023-04-02 DIAGNOSIS — G8929 Other chronic pain: Secondary | ICD-10-CM | POA: Diagnosis not present

## 2023-04-02 DIAGNOSIS — R899 Unspecified abnormal finding in specimens from other organs, systems and tissues: Secondary | ICD-10-CM

## 2023-04-02 DIAGNOSIS — R7303 Prediabetes: Secondary | ICD-10-CM

## 2023-04-02 DIAGNOSIS — M545 Low back pain, unspecified: Secondary | ICD-10-CM | POA: Diagnosis not present

## 2023-04-02 DIAGNOSIS — E782 Mixed hyperlipidemia: Secondary | ICD-10-CM | POA: Diagnosis not present

## 2023-04-02 MED ORDER — CYCLOBENZAPRINE HCL 10 MG PO TABS: 10.0000 mg | ORAL_TABLET | Freq: Three times a day (TID) | ORAL | 0 refills | Status: DC | PRN

## 2023-04-02 MED ORDER — LEVOTHYROXINE SODIUM 25 MCG PO TABS: 25.0000 ug | ORAL_TABLET | Freq: Every day | ORAL | 2 refills | Status: DC

## 2023-04-02 NOTE — Progress Notes (Signed)
Subjective:  Patient ID: Sonya Francis, female    DOB: [birth year]  Age: 57 y.o. MRN: [id number]  Chief Complaint  Patient presents with   Medical Management of Chronic Issues    HPI    Prediabetes: A1C 5.7%  HTN: Metoprolol 25 mg daily and Torsemide 5 mg daily.  Hypothyroidism: Takes Levothyroxine 50 mcg daily.  Discussed the use of AI scribe software for clinical note transcription with the patient, who gave verbal consent to proceed.  History of Present Illness   A 57 year old patient with a history of liver disease and chronic back pain presents for a follow-up visit. The patient reports no new symptoms since the last visit. The patient's liver condition has been under investigation, with recent ultrasound results showing no lesions or abnormalities in blood flow. However, there is suggestive steatosis, which could be due to long-standing high cholesterol levels.  The patient's chronic back pain remains a significant concern. The pain is described as a sharp, stabbing sensation in the lower back, which is exacerbated by sweeping and other rotational movements. The pain does not radiate down the legs and is localized to the lower back area. The patient reports no relief from previous treatments, including meloxicam. The patient also mentions a previous back x-ray done two years ago, which showed a bone spur.  The patient is also on Synthroid for thyroid management. She expresses a desire to stop taking the medication if her thyroid levels are normal, as she finds the medication regimen inconvenient. Discussed with the patient that this medicine is usually having to be taken for the rest of her life. She said she had heard that but still wanted to try and stop the medicine due to the thyroid issue she believes stemming from her experience with COVID          04/02/2023   10:09 AM 12/29/2022   11:04 AM 09/25/2022   11:01 AM 12/26/2021   10:04 AM 06/24/2021    9:08 AM  Depression screen  PHQ 2/9  Decreased Interest 1 3 0 0 0  Down, Depressed, Hopeless 0 0 0 0 0  PHQ - 2 Score 1 3 0 0 0  Altered sleeping 0 0 0 0   Tired, decreased energy 0 3 3 0   Change in appetite 3 0 0 0   Feeling bad or failure about yourself  0 0 0 0   Trouble concentrating 0 0 0 0   Moving slowly or fidgety/restless 0 0 0 0   Suicidal thoughts 0 0 0 0   PHQ-9 Score 4 6 3  0   Difficult doing work/chores Somewhat difficult Not difficult at all Not difficult at all Not difficult at all         04/02/2023   10:08 AM  Fall Risk   Falls in the past year? 0  Number falls in past yr: 0  Injury with Fall? 0  Risk for fall due to : No Fall Risks  Follow up Falls evaluation completed    Patient Care Team: Langley Gauss, [place] as PCP - General (Physician Assistant)   Review of Systems  Constitutional:  Negative for chills, fatigue and fever.  HENT:  Negative for congestion, ear pain and sore throat.   Respiratory:  Negative for cough and shortness of breath.   Cardiovascular:  Negative for chest pain and palpitations.  Gastrointestinal:  Negative for abdominal pain, constipation, diarrhea, nausea and vomiting.  Endocrine: Negative for polydipsia, polyphagia and polyuria.  Genitourinary:  Negative for difficulty urinating and dysuria.  Musculoskeletal:  Positive for arthralgias and myalgias. Negative for back pain.  Skin:  Negative for rash.  Neurological:  Negative for weakness, numbness and headaches.  Psychiatric/Behavioral:  Negative for dysphoric mood. The patient is not nervous/anxious.     Current Outpatient Medications on File Prior to Visit  Medication Sig Dispense Refill   Apple Cider Vinegar 300 MG TABS Take 600 mg by mouth daily.     fluticasone (FLONASE) 50 MCG/ACT nasal spray Place 1 spray into both nostrils daily.     levothyroxine (SYNTHROID) 50 MCG tablet Take 1 tablet (50 mcg total) by mouth daily. 90 tablet 1   loratadine (CLARITIN) 10 MG tablet TAKE 1 TABLET BY MOUTH EVERY DAY  90 tablet 0   metoprolol succinate (TOPROL-XL) 25 MG 24 hr tablet Take 1 tablet (25 mg total) by mouth daily. 90 tablet 3   Multiple Vitamins-Minerals (ONE-A-DAY WOMENS PO) Take by mouth. With vitamin d     torsemide (DEMADEX) 5 MG tablet Take 1 tablet (5 mg total) by mouth daily. 90 tablet 3   No current facility-administered medications on file prior to visit.   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   Elevated TSH    Tendonitis of ankle or foot 08/13/2015   Past Surgical History:  Procedure Laterality Date   CHOLECYSTECTOMY  09/2021    Family History  Problem Relation Age of Onset   Cancer Mother    Cancer Father    Social History   Socioeconomic History   Marital status: Married    Spouse name: Not on file   Number of children: Not on file   Years of education: Not on file   Highest education level: Not on file  Occupational History   Not on file  Tobacco Use   Smoking status: Never   Smokeless tobacco: Never  Vaping Use   Vaping status: Never Used  Substance and Sexual Activity   Alcohol use: Never   Drug use: Never   Sexual activity: Yes    Partners: Male  Other Topics Concern   Not on file  Social History Narrative   Not on file   Social Drivers of Health   Financial Resource Strain: Low Risk  (12/26/2021)   Overall Financial Resource Strain (CARDIA)    Difficulty of Paying Living Expenses: Not hard at all  Food Insecurity: No Food Insecurity (12/26/2021)   Hunger Vital Sign    Worried About Running Out of Food in the Last Year: Never true    Ran Out of Food in the Last Year: Never true  Transportation Needs: No Transportation Needs (12/26/2021)   PRAPARE - Administrator, Civil Service (Medical): No    Lack of Transportation (Non-Medical): No  Physical Activity: Inactive (09/25/2022)   Exercise Vital Sign    Days of Exercise per Week: 0 days    Minutes of Exercise per Session: 0 min  Stress: No Stress  Concern Present (12/26/2021)   [employer] of Occupational Health - Occupational Stress Questionnaire    Feeling of Stress : Not at all  Social Connections: Moderately Isolated (12/26/2021)   Social Connection and Isolation Panel [NHANES]    Frequency of Communication with Friends and Family: More than three times a week    Frequency of Social Gatherings with Friends and Family: More than three times a week    Attends Religious Services: Never    Active Member of  Clubs or Organizations: No    Attends Banker Meetings: Never    Marital Status: Married    Objective:  BP 120/70   Pulse 87   Temp (!) 97.4 F (36.3 C)   Resp 14   Ht 5\' 5"  (1.651 m)   Wt 229 lb (103.9 kg)   LMP  (LMP Unknown)   SpO2 97%   BMI 38.11 kg/m      04/02/2023   10:08 AM 12/29/2022   10:40 AM 09/25/2022   11:00 AM  BP/Weight  Systolic BP 120 120 136  Diastolic BP 70 70 82  Wt. (Lbs) 229 230 227  BMI 38.11 kg/m2 38.27 kg/m2 37.77 kg/m2    Physical Exam Vitals reviewed.  Constitutional:      Appearance: Normal appearance.  Cardiovascular:     Rate and Rhythm: Normal rate and regular rhythm.     Heart sounds: Normal heart sounds.  Pulmonary:     Effort: Pulmonary effort is normal.     Breath sounds: Normal breath sounds.  Abdominal:     General: Bowel sounds are normal.     Palpations: Abdomen is soft.     Tenderness: There is no abdominal tenderness.  Musculoskeletal:     Lumbar back: Spasms and tenderness present. No swelling, deformity or bony tenderness. Normal range of motion.  Neurological:     Mental Status: She is alert and oriented to person, place, and time.  Psychiatric:        Mood and Affect: Mood normal.        Behavior: Behavior normal.     Diabetic Foot Exam - Simple   No data filed      Lab Results  Component Value Date   WBC 6.5 04/02/2023   HGB 13.9 04/02/2023   HCT 43.0 04/02/2023   PLT 318 04/02/2023   GLUCOSE 105 (H) 04/02/2023   CHOL 177  04/02/2023   TRIG 94 04/02/2023   HDL 49 04/02/2023   LDLCALC 111 (H) 04/02/2023   ALT 26 04/02/2023   AST 19 04/02/2023   NA 143 04/02/2023   K 4.6 04/02/2023   CL 106 04/02/2023   CREATININE 0.82 04/02/2023   BUN 14 04/02/2023   CO2 23 04/02/2023   TSH 2.540 04/02/2023   HGBA1C 5.7 (H) 04/02/2023      Assessment & Plan:    Hypothyroidism (acquired) Assessment & Plan: Patient on Synthroid, expressing desire to decrease or discontinue medication. -If thyroid labs are normal today, decrease Synthroid to daily. -Repeat thyroid labs in 6 weeks to assess response to decreased dose.  Orders: -     CBC with Differential/Platelet -     Comprehensive metabolic panel -     T4, free -     TSH -     Levothyroxine Sodium; Take 1 tablet (25 mcg total) by mouth daily.  Dispense: 90 tablet; Refill: 2  Prediabetes Assessment & Plan: Controlled Labs drawn today Continue to monitor diet and exercise Will adjust treatment depending on results  Orders: -     Hemoglobin A1c  Mixed hyperlipidemia Assessment & Plan: Chronic high cholesterol possibly contributing to liver steatosis. -Continue monitoring and management.  Orders: -     Lipid panel  Chronic midline low back pain without sciatica Assessment & Plan: Persistent despite previous interventions. No radiation of pain or neurological symptoms. -Order lumbar spine x-ray for comparison with previous imaging. -Prescribe low-dose muscle relaxer to trial for pain relief. -Investigate status of previous  physical therapy referral.  Orders: -     DG Lumbar Spine Complete; Future -     Cyclobenzaprine HCl; Take 1 tablet (10 mg total) by mouth 3 (three) times daily as needed for muscle spasms.  Dispense: 90 tablet; Refill: 0 -     Ambulatory referral to Physical Therapy  Elevated liver enzymes Assessment & Plan: Ultrasound showed increased echogenicity suggestive of steatosis. No lesions or blood flow issues.  Discussed possible causes including hepatitis and high cholesterol. -Order hepatitis labs today to rule out viral cause. -If hepatitis negative, consider referral to GI for further management of suspected non-alcoholic fatty liver disease.   Abnormal laboratory test result -     Acute Viral Hepatitis (HAV, HBV, HCV)  Other orders -     Interpretation:     Meds ordered this encounter  Medications   levothyroxine (SYNTHROID) 25 MCG tablet    Sig: Take 1 tablet (25 mcg total) by mouth daily.    Dispense:  90 tablet    Refill:  2   cyclobenzaprine (FLEXERIL) 10 MG tablet    Sig: Take 1 tablet (10 mg total) by mouth 3 (three) times daily as needed for muscle spasms.    Dispense:  90 tablet    Refill:  0    Orders Placed This Encounter  Procedures   DG Lumbar Spine Complete   CBC with Differential/Platelet   Comprehensive metabolic panel   Hemoglobin A1c   Lipid panel   Acute Viral Hepatitis (HAV, HBV, HCV)   T4, free   TSH   Interpretation:   Ambulatory referral to Physical Therapy    General Health Maintenance -Confirm patient completed mammogram. -Address work compatibility narrative for long-term disability if form received. -Schedule follow-up visit in April.        Follow-up: Return in about 6 weeks (around 05/14/2023) for nurse visit.   I,Marla I Leal-Borjas,acting as a scribe for [employer], PA.,have documented all relevant documentation on the behalf of Langley Gauss, PA,as directed by  Langley Gauss, PA while in the presence of Langley Gauss, [place].   An After Visit Summary was printed and given to the patient.  Langley Gauss, [place] [facility] [phone]

## 2023-04-02 NOTE — Assessment & Plan Note (Signed)
Persistent despite previous interventions. No radiation of pain or neurological symptoms. -Order lumbar spine x-ray for comparison with previous imaging. -Prescribe low-dose muscle relaxer to trial for pain relief. -Investigate status of previous physical therapy referral.

## 2023-04-02 NOTE — Assessment & Plan Note (Signed)
Controlled Labs drawn today Continue to monitor diet and exercise Will adjust treatment depending on results

## 2023-04-02 NOTE — Assessment & Plan Note (Signed)
Chronic high cholesterol possibly contributing to liver steatosis. -Continue monitoring and management.

## 2023-04-02 NOTE — Assessment & Plan Note (Signed)
Ultrasound showed increased echogenicity suggestive of steatosis. No lesions or blood flow issues. Discussed possible causes including hepatitis and high cholesterol. -Order hepatitis labs today to rule out viral cause. -If hepatitis negative, consider referral to GI for further management of suspected non-alcoholic fatty liver disease.

## 2023-04-02 NOTE — Assessment & Plan Note (Signed)
Patient on Synthroid, expressing desire to decrease or discontinue medication. -If thyroid labs are normal today, decrease Synthroid to daily. -Repeat thyroid labs in 6 weeks to assess response to decreased dose.

## 2023-04-03 DIAGNOSIS — M545 Low back pain, unspecified: Secondary | ICD-10-CM | POA: Diagnosis not present

## 2023-04-03 DIAGNOSIS — M47816 Spondylosis without myelopathy or radiculopathy, lumbar region: Secondary | ICD-10-CM | POA: Diagnosis not present

## 2023-04-03 DIAGNOSIS — G8929 Other chronic pain: Secondary | ICD-10-CM | POA: Diagnosis not present

## 2023-04-03 LAB — CBC WITH DIFFERENTIAL/PLATELET
Basophils Absolute: 0 10*3/uL (ref 0.0–0.2)
Basos: 1 %
EOS (ABSOLUTE): 0.1 10*3/uL (ref 0.0–0.4)
Eos: 1 %
Hematocrit: 43 % (ref 34.0–46.6)
Hemoglobin: 13.9 g/dL (ref 11.1–15.9)
Immature Grans (Abs): 0 10*3/uL (ref 0.0–0.1)
Immature Granulocytes: 0 %
Lymphocytes Absolute: 2.2 10*3/uL (ref 0.7–3.1)
Lymphs: 34 %
MCH: 30.5 pg (ref 26.6–33.0)
MCHC: 32.3 g/dL (ref 31.5–35.7)
MCV: 95 fL (ref 79–97)
Monocytes Absolute: 0.4 10*3/uL (ref 0.1–0.9)
Monocytes: 7 %
Neutrophils Absolute: 3.7 10*3/uL (ref 1.4–7.0)
Neutrophils: 57 %
Platelets: 318 10*3/uL (ref 150–450)
RBC: 4.55 x10E6/uL (ref 3.77–5.28)
RDW: 13.1 % (ref 11.7–15.4)
WBC: 6.5 10*3/uL (ref 3.4–10.8)

## 2023-04-03 LAB — LIPID PANEL
Chol/HDL Ratio: 3.6 {ratio} (ref 0.0–4.4)
Cholesterol, Total: 177 mg/dL (ref 100–199)
HDL: 49 mg/dL (ref >39)
LDL Chol Calc (NIH): 111 mg/dL — ABNORMAL HIGH (ref 0–99)
Triglycerides: 94 mg/dL (ref 0–149)
VLDL Cholesterol Cal: 17 mg/dL (ref 5–40)

## 2023-04-03 LAB — COMPREHENSIVE METABOLIC PANEL
ALT: 26 [IU]/L (ref 0–32)
AST: 19 [IU]/L (ref 0–40)
Albumin: 4.2 g/dL (ref 3.8–4.9)
Alkaline Phosphatase: 121 [IU]/L (ref 44–121)
BUN/Creatinine Ratio: 17 (ref 9–23)
BUN: 14 mg/dL (ref 6–24)
Bilirubin Total: 0.3 mg/dL (ref 0.0–1.2)
CO2: 23 mmol/L (ref 20–29)
Calcium: 9.4 mg/dL (ref 8.7–10.2)
Chloride: 106 mmol/L (ref 96–106)
Creatinine, Ser: 0.82 mg/dL (ref 0.57–1.00)
Globulin, Total: 2.5 g/dL (ref 1.5–4.5)
Glucose: 105 mg/dL — ABNORMAL HIGH (ref 70–99)
Potassium: 4.6 mmol/L (ref 3.5–5.2)
Sodium: 143 mmol/L (ref 134–144)
Total Protein: 6.7 g/dL (ref 6.0–8.5)
eGFR: 82 mL/min/{1.73_m2} (ref >59)

## 2023-04-03 LAB — ACUTE VIRAL HEPATITIS (HAV, HBV, HCV)
HCV Ab: NONREACTIVE
Hep A IgM: NEGATIVE
Hep B C IgM: NEGATIVE
Hepatitis B Surface Ag: NEGATIVE

## 2023-04-03 LAB — HCV INTERPRETATION

## 2023-04-03 LAB — T4, FREE: Free T4: 1.33 ng/dL (ref 0.82–1.77)

## 2023-04-03 LAB — HEMOGLOBIN A1C
Est. average glucose Bld gHb Est-mCnc: 117 mg/dL
Hgb A1c MFr Bld: 5.7 % — ABNORMAL HIGH (ref 4.8–5.6)

## 2023-04-03 LAB — TSH: TSH: 2.54 u[IU]/mL (ref 0.450–4.500)

## 2023-04-06 ENCOUNTER — Encounter: Payer: Self-pay | Admitting: Physician Assistant

## 2023-04-06 ENCOUNTER — Telehealth: Payer: Self-pay

## 2023-04-06 NOTE — Telephone Encounter (Signed)
[employer] [facility]: REVIEWING DISABILITY BENEFITS

## 2023-04-10 ENCOUNTER — Telehealth: Payer: Self-pay

## 2023-04-10 NOTE — Telephone Encounter (Signed)
 Patient was notified of the appointment at [facility] on 01/162025 at 3:00 pm.

## 2023-04-23 DIAGNOSIS — R070 Pain in throat: Secondary | ICD-10-CM | POA: Diagnosis not present

## 2023-04-23 DIAGNOSIS — R5383 Other fatigue: Secondary | ICD-10-CM | POA: Diagnosis not present

## 2023-04-23 DIAGNOSIS — J209 Acute bronchitis, unspecified: Secondary | ICD-10-CM | POA: Diagnosis not present

## 2023-04-23 DIAGNOSIS — R0981 Nasal congestion: Secondary | ICD-10-CM | POA: Diagnosis not present

## 2023-04-23 DIAGNOSIS — R051 Acute cough: Secondary | ICD-10-CM | POA: Diagnosis not present

## 2023-04-23 DIAGNOSIS — R519 Headache, unspecified: Secondary | ICD-10-CM | POA: Diagnosis not present

## 2023-05-02 ENCOUNTER — Other Ambulatory Visit: Payer: Self-pay | Admitting: Physician Assistant

## 2023-05-02 DIAGNOSIS — E039 Hypothyroidism, unspecified: Secondary | ICD-10-CM

## 2023-05-06 DIAGNOSIS — M545 Low back pain, unspecified: Secondary | ICD-10-CM | POA: Diagnosis not present

## 2023-05-08 ENCOUNTER — Telehealth: Payer: Self-pay | Admitting: Physician Assistant

## 2023-05-08 NOTE — Telephone Encounter (Signed)
[facility] [place]-PLAN OF CARE START OF CARE DATE 04/23/2023

## 2023-05-11 DIAGNOSIS — M545 Low back pain, unspecified: Secondary | ICD-10-CM | POA: Diagnosis not present

## 2023-05-13 ENCOUNTER — Other Ambulatory Visit: Payer: Self-pay

## 2023-05-13 DIAGNOSIS — R7303 Prediabetes: Secondary | ICD-10-CM

## 2023-05-13 DIAGNOSIS — E782 Mixed hyperlipidemia: Secondary | ICD-10-CM

## 2023-05-13 DIAGNOSIS — M545 Low back pain, unspecified: Secondary | ICD-10-CM | POA: Diagnosis not present

## 2023-05-14 ENCOUNTER — Ambulatory Visit: Payer: HMO

## 2023-05-14 DIAGNOSIS — E782 Mixed hyperlipidemia: Secondary | ICD-10-CM

## 2023-05-14 DIAGNOSIS — E039 Hypothyroidism, unspecified: Secondary | ICD-10-CM

## 2023-05-14 DIAGNOSIS — R7303 Prediabetes: Secondary | ICD-10-CM

## 2023-05-15 LAB — TSH: TSH: 3.29 u[IU]/mL (ref 0.450–4.500)

## 2023-05-18 ENCOUNTER — Encounter: Payer: Self-pay | Admitting: Physician Assistant

## 2023-05-19 ENCOUNTER — Other Ambulatory Visit: Payer: Self-pay | Admitting: Physician Assistant

## 2023-05-19 DIAGNOSIS — E039 Hypothyroidism, unspecified: Secondary | ICD-10-CM

## 2023-05-19 MED ORDER — LEVOTHYROXINE SODIUM 25 MCG PO TABS: 25.0000 ug | ORAL_TABLET | Freq: Every day | ORAL | 2 refills | Status: DC

## 2023-05-19 NOTE — Telephone Encounter (Signed)
Copied from CRM [id number]. Topic: Clinical - Medication Refill >> May 19, 2023  2:26 PM Sonya Francis wrote: Most Recent Primary Care Visit:  Provider: Precious Reel  Department: [facility]  Visit Type: CLINICAL SUPPORT  Date: 05/14/2023  Medication: levothyroxine (SYNTHROID) 25 MCG tablet  Has the patient contacted their pharmacy? Yes (Agent: If no, request that the patient contact the pharmacy for the refill. If patient does not wish to contact the pharmacy document the reason why and proceed with request.) (Agent: If yes, when and what did the pharmacy advise?)  Is this the correct pharmacy for this prescription? Yes If no, delete pharmacy and type the correct one.  This is the patient's preferred pharmacy:  [id number] - [place], [place] - [address] [place] [address] Phone: [phone] Fax: [phone]   Has the prescription been filled recently? No  Is the patient out of the medication? Yes  Has the patient been seen for an appointment in the last year OR does the patient have an upcoming appointment? Yes   Can we respond through MyChart? Yes  Agent: Please be advised that Rx refills may take up to 3 business days. We ask that you follow-up with your pharmacy.

## 2023-05-20 DIAGNOSIS — M545 Low back pain, unspecified: Secondary | ICD-10-CM | POA: Diagnosis not present

## 2023-05-25 DIAGNOSIS — M545 Low back pain, unspecified: Secondary | ICD-10-CM | POA: Diagnosis not present

## 2023-06-02 DIAGNOSIS — M545 Low back pain, unspecified: Secondary | ICD-10-CM | POA: Diagnosis not present

## 2023-06-03 ENCOUNTER — Telehealth: Payer: Self-pay

## 2023-06-03 ENCOUNTER — Other Ambulatory Visit: Payer: Self-pay | Admitting: Physician Assistant

## 2023-06-03 DIAGNOSIS — G8929 Other chronic pain: Secondary | ICD-10-CM

## 2023-06-03 NOTE — Telephone Encounter (Signed)
 Copied from CRM [id number]. Topic: General - Other >> Jun 02, 2023  4:56 PM Antony Haste wrote: Reason for CRM: The patient stated she was advised 6 weeks of therapy for her back would be needed prior to completing an MRI. She is currently on her 6th week and was advised by her therapist to inform her PCP to have the request sent in. The patient is requesting a callback once this has been completed to determine when she schedule her MRI. Callback #: [phone]

## 2023-06-03 NOTE — Telephone Encounter (Signed)
 A procedure order is needed for MRI

## 2023-06-04 ENCOUNTER — Ambulatory Visit (INDEPENDENT_AMBULATORY_CARE_PROVIDER_SITE_OTHER)
Admission: RE | Admit: 2023-06-04 | Discharge: 2023-06-04 | Disposition: A | Discharge status: Home / Self Care | Payer: HMO | Source: Ambulatory Visit | Attending: Physician Assistant | Admitting: Physician Assistant

## 2023-06-04 DIAGNOSIS — G8929 Other chronic pain: Secondary | ICD-10-CM | POA: Diagnosis not present

## 2023-06-04 DIAGNOSIS — M545 Low back pain, unspecified: Secondary | ICD-10-CM

## 2023-06-04 DIAGNOSIS — M48061 Spinal stenosis, lumbar region without neurogenic claudication: Secondary | ICD-10-CM | POA: Diagnosis not present

## 2023-06-04 DIAGNOSIS — M5116 Intervertebral disc disorders with radiculopathy, lumbar region: Secondary | ICD-10-CM | POA: Diagnosis not present

## 2023-06-04 DIAGNOSIS — M4807 Spinal stenosis, lumbosacral region: Secondary | ICD-10-CM | POA: Diagnosis not present

## 2023-06-04 DIAGNOSIS — M4726 Other spondylosis with radiculopathy, lumbar region: Secondary | ICD-10-CM | POA: Diagnosis not present

## 2023-06-05 DIAGNOSIS — M545 Low back pain, unspecified: Secondary | ICD-10-CM | POA: Diagnosis not present

## 2023-06-10 ENCOUNTER — Telehealth: Payer: Self-pay

## 2023-06-10 NOTE — Telephone Encounter (Signed)
 Will call once results are available.  June 10, 2023  2:07 PM Elle L wrote: Reason for CRM: The patient is requesting a call back at [phone] regarding her MRI results.

## 2023-06-16 ENCOUNTER — Telehealth: Payer: Self-pay

## 2023-06-16 NOTE — Telephone Encounter (Signed)
 Called [facility] and spoke to [place] and she stated that she will get this Mri ready right away.

## 2023-06-16 NOTE — Telephone Encounter (Signed)
 Pt called back to follow-up. She asked if Huston Foley could check on the status since this is her third time calling. Please call and advise.

## 2023-06-16 NOTE — Telephone Encounter (Signed)
 Copied from CRM [id number]. Topic: Clinical - Lab/Test Results >> Jun 12, 2023  4:46 PM Macon Large wrote: Reason for CRM: Patient called for MRI results and requests a call back at [phone] >> Jun 16, 2023 12:55 PM Abundio Miu S wrote: Patient requesting status of MRI results

## 2023-06-29 ENCOUNTER — Telehealth: Payer: Self-pay

## 2023-06-29 NOTE — Telephone Encounter (Signed)
 This patient needs a Welcome to [employer] scheduled with a provider.  Please try to schedule this with our new providers Norwood Levo, Dr. Faylene Kurtz, and Huston Foley). Please note that Welcome to Medicare is for: New Medicare patients within 12 months of starting Part B coverage.

## 2023-07-01 NOTE — Telephone Encounter (Signed)
 Contacted Sonya Francis to schedule their annual wellness visit. Patient declined to schedule AWV at this time.

## 2023-07-03 DIAGNOSIS — M5451 Vertebrogenic low back pain: Secondary | ICD-10-CM | POA: Diagnosis not present

## 2023-07-30 ENCOUNTER — Ambulatory Visit (INDEPENDENT_AMBULATORY_CARE_PROVIDER_SITE_OTHER): Payer: HMO | Admitting: Physician Assistant

## 2023-07-30 ENCOUNTER — Encounter: Payer: Self-pay | Admitting: Physician Assistant

## 2023-07-30 VITALS — BP 148/82 | HR 91 | Temp 97.1°F | Ht 65.0 in | Wt 232.0 lb

## 2023-07-30 DIAGNOSIS — M545 Low back pain, unspecified: Secondary | ICD-10-CM | POA: Diagnosis not present

## 2023-07-30 DIAGNOSIS — R7303 Prediabetes: Secondary | ICD-10-CM

## 2023-07-30 DIAGNOSIS — E782 Mixed hyperlipidemia: Secondary | ICD-10-CM

## 2023-07-30 DIAGNOSIS — R030 Elevated blood-pressure reading, without diagnosis of hypertension: Secondary | ICD-10-CM

## 2023-07-30 DIAGNOSIS — G8929 Other chronic pain: Secondary | ICD-10-CM | POA: Diagnosis not present

## 2023-07-30 DIAGNOSIS — E039 Hypothyroidism, unspecified: Secondary | ICD-10-CM | POA: Diagnosis not present

## 2023-07-30 NOTE — Progress Notes (Signed)
 Subjective:  Patient ID: Sonya Francis, female    DOB: [birth year]  Age: 58 y.o. MRN: [id number]  Chief Complaint  Patient presents with   Medical Management of Chronic Issues    HPI:  Discussed the use of AI scribe software for clinical note transcription with the patient, who gave verbal consent to proceed.  History of Present Illness   The patient, with a history of arthritis and bulging discs in the lumbar spine, presents with chronic back pain. The pain has not improved since the last visit. The patient has been in contact with her insurance company regarding her condition and has had an MRI, which she hoped would be explained during a visit with Dr. Vaughn Georges. However, the patient did not get the explanation she was hoping for. The patient has been taking natural supplements for inflammation, which have provided some relief. The patient is also on thyroid  medication, which she is considering discontinuing due to normal thyroid  levels in recent blood work.          07/30/2023   10:58 AM 04/02/2023   10:09 AM 12/29/2022   11:04 AM 09/25/2022   11:01 AM 12/26/2021   10:04 AM  Depression screen PHQ 2/9  Decreased Interest 2 1 3  0 0  Down, Depressed, Hopeless 0 0 0 0 0  PHQ - 2 Score 2 1 3  0 0  Altered sleeping 0 0 0 0 0  Tired, decreased energy 2 0 3 3 0  Change in appetite 0 3 0 0 0  Feeling bad or failure about yourself  0 0 0 0 0  Trouble concentrating 0 0 0 0 0  Moving slowly or fidgety/restless 0 0 0 0 0  Suicidal thoughts 0 0 0 0 0  PHQ-9 Score 4 4 6 3  0  Difficult doing work/chores Not difficult at all Somewhat difficult Not difficult at all Not difficult at all Not difficult at all        07/30/2023   10:58 AM  Fall Risk   Falls in the past year? 0  Number falls in past yr: 0  Injury with Fall? 0  Risk for fall due to : No Fall Risks  Follow up Falls evaluation completed    Patient Care Team: Odilia Bennett, [place] as PCP - General (Physician Assistant)   Review of  Systems  Constitutional:  Negative for appetite change, fatigue and fever.  HENT:  Negative for congestion, ear pain, sinus pressure and sore throat.   Respiratory:  Negative for cough, chest tightness, shortness of breath and wheezing.   Cardiovascular:  Negative for chest pain and palpitations.  Gastrointestinal:  Negative for abdominal pain, constipation, diarrhea, nausea and vomiting.  Genitourinary:  Negative for dysuria and hematuria.  Musculoskeletal:  Negative for arthralgias, back pain, joint swelling and myalgias.  Skin:  Negative for rash.  Neurological:  Negative for dizziness, weakness and headaches.  Psychiatric/Behavioral:  Negative for dysphoric mood. The patient is not nervous/anxious.     Current Outpatient Medications on File Prior to Visit  Medication Sig Dispense Refill   Apple Cider Vinegar 300 MG TABS Take 600 mg by mouth daily.     fluticasone (FLONASE) 50 MCG/ACT nasal spray Place 1 spray into both nostrils daily.     loratadine  (CLARITIN ) 10 MG tablet TAKE 1 TABLET BY MOUTH EVERY DAY 90 tablet 0   metoprolol  succinate (TOPROL -XL) 25 MG 24 hr tablet Take 1 tablet (25 mg total) by mouth daily. 90 tablet 3  Multiple Vitamins-Minerals (ONE-A-DAY WOMENS PO) Take by mouth. With vitamin d      OVER THE COUNTER MEDICATION CURA.MED  For  back pain  750mg  take 1 tablet daily.     torsemide  (DEMADEX ) 5 MG tablet Take 1 tablet (5 mg total) by mouth daily. 90 tablet 3   No current facility-administered medications on file prior to visit.   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   Elevated TSH    Tendonitis of ankle or foot 08/13/2015   Past Surgical History:  Procedure Laterality Date   CHOLECYSTECTOMY  09/2021    Family History  Problem Relation Age of Onset   Cancer Mother    Cancer Father    Social History   Socioeconomic History   Marital status: Married    Spouse name: Not on file   Number of children: Not on file    Years of education: Not on file   Highest education level: Not on file  Occupational History   Not on file  Tobacco Use   Smoking status: Never   Smokeless tobacco: Never  Vaping Use   Vaping status: Never Used  Substance and Sexual Activity   Alcohol use: Never   Drug use: Never   Sexual activity: Yes    Partners: Male  Other Topics Concern   Not on file  Social History Narrative   Not on file   Social Drivers of Health   Financial Resource Strain: Low Risk  (12/26/2021)   Overall Financial Resource Strain (CARDIA)    Difficulty of Paying Living Expenses: Not hard at all  Food Insecurity: No Food Insecurity (12/26/2021)   Hunger Vital Sign    Worried About Running Out of Food in the Last Year: Never true    Ran Out of Food in the Last Year: Never true  Transportation Needs: No Transportation Needs (12/26/2021)   PRAPARE - Administrator, Civil Service (Medical): No    Lack of Transportation (Non-Medical): No  Physical Activity: Inactive (09/25/2022)   Exercise Vital Sign    Days of Exercise per Week: 0 days    Minutes of Exercise per Session: 0 min  Stress: No Stress Concern Present (12/26/2021)   [employer] of Occupational Health - Occupational Stress Questionnaire    Feeling of Stress : Not at all  Social Connections: Moderately Isolated (12/26/2021)   Social Connection and Isolation Panel [NHANES]    Frequency of Communication with Friends and Family: More than three times a week    Frequency of Social Gatherings with Friends and Family: More than three times a week    Attends Religious Services: Never    Diplomatic Services operational officer: No    Attends Engineer, structural: Never    Marital Status: Married    Objective:  BP (!) 148/82 (BP Location: Left Arm, Patient Position: Sitting)   Pulse 91   Temp (!) 97.1 F (36.2 C) (Temporal)   Ht 5\' 5"  (1.651 m)   Wt 232 lb (105.2 kg)   LMP  (LMP Unknown)   SpO2 96%   BMI 38.61 kg/m       07/30/2023   11:18 AM 07/30/2023   10:57 AM 04/02/2023   10:08 AM  BP/Weight  Systolic BP 148 160 120  Diastolic BP 82 82 70  Wt. (Lbs)  232 229  BMI  38.61 kg/m2 38.11 kg/m2    Physical Exam Vitals reviewed.  Constitutional:  Appearance: Normal appearance.  Cardiovascular:     Rate and Rhythm: Normal rate and regular rhythm.     Heart sounds: Normal heart sounds.  Pulmonary:     Effort: Pulmonary effort is normal.     Breath sounds: Normal breath sounds.  Abdominal:     General: Bowel sounds are normal.     Palpations: Abdomen is soft.     Tenderness: There is no abdominal tenderness.  Neurological:     Mental Status: She is alert and oriented to person, place, and time.  Psychiatric:        Mood and Affect: Mood normal.        Behavior: Behavior normal.     Diabetic Foot Exam - Simple   No data filed      Lab Results  Component Value Date   WBC 6.1 07/30/2023   HGB 14.3 07/30/2023   HCT 43.0 07/30/2023   PLT 325 07/30/2023   GLUCOSE 108 (H) 07/30/2023   CHOL 174 07/30/2023   TRIG 94 07/30/2023   HDL 46 07/30/2023   LDLCALC 111 (H) 07/30/2023   ALT 32 07/30/2023   AST 23 07/30/2023   NA 141 07/30/2023   K 4.8 07/30/2023   CL 105 07/30/2023   CREATININE 0.88 07/30/2023   BUN 10 07/30/2023   CO2 22 07/30/2023   TSH 2.800 07/30/2023   HGBA1C 5.7 (H) 07/30/2023      Assessment & Plan:  Hypothyroidism (acquired) Assessment & Plan: Thyroid  disorder managed with levothyroxine , reduced to 25 mcg. Recent tests show normal thyroid  levels. - Order thyroid  function test to establish baseline. - Discontinue levothyroxine  if thyroid  levels are normal. - Re-evaluate thyroid  function at next visit.  Orders: -     TSH  Prediabetes Assessment & Plan: Controlled Labs drawn today Continue to monitor diet and exercise Will adjust treatment depending on results  Orders: -     Hemoglobin A1c  Mixed hyperlipidemia Assessment & Plan: Well  controlled.  Continue to work on eating a healthy diet and exercise.  Labs drawn today.   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 - Continue anti-inflammatory supplements (turmeric and curcumin). - Consider lumbar injections if pain becomes unmanageable. - Avoid surgery unless necessary.  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 Similar issues at L4-L5 and L5-S1 with arthritis and foraminal stenosis. - Continue anti-inflammatory supplements (turmeric and curcumin). - Consider lumbar injections if pain becomes unmanageable. - Avoid surgery unless necessary.   Elevated BP without diagnosis of hypertension Assessment & Plan: Admits to increased back pain that may be causing increased pressure Continue to monitor If staying elevated we will add medication      No orders of the defined types were placed in this encounter.   Orders Placed This Encounter  Procedures   CBC with Differential/Platelet   Comprehensive metabolic panel with GFR   Lipid panel   Hemoglobin A1c   TSH    General Health Maintenance Routine health maintenance discussed. -  Order blood count, cholesterol, A1c, kidney, and liver function tests.        Follow-up: Return in about 3 months (around 10/29/2023) for Chronic, Mirian Ames.   I,Lauren M Auman,acting as a Neurosurgeon for [employer], [employer].,have documented all relevant documentation on the behalf of Odilia Bennett, PA,as  directed by  Odilia Bennett, PA while in the presence of Odilia Bennett, [place].   An After Visit Summary was printed and given to the patient.  Odilia Bennett, [place] [facility] [phone]

## 2023-07-31 LAB — CBC WITH DIFFERENTIAL/PLATELET
Basophils Absolute: 0 10*3/uL (ref 0.0–0.2)
Basos: 1 %
EOS (ABSOLUTE): 0 10*3/uL (ref 0.0–0.4)
Eos: 1 %
Hematocrit: 43 % (ref 34.0–46.6)
Hemoglobin: 14.3 g/dL (ref 11.1–15.9)
Immature Grans (Abs): 0 10*3/uL (ref 0.0–0.1)
Immature Granulocytes: 0 %
Lymphocytes Absolute: 2.2 10*3/uL (ref 0.7–3.1)
Lymphs: 36 %
MCH: 30.9 pg (ref 26.6–33.0)
MCHC: 33.3 g/dL (ref 31.5–35.7)
MCV: 93 fL (ref 79–97)
Monocytes Absolute: 0.5 10*3/uL (ref 0.1–0.9)
Monocytes: 8 %
Neutrophils Absolute: 3.4 10*3/uL (ref 1.4–7.0)
Neutrophils: 54 %
Platelets: 325 10*3/uL (ref 150–450)
RBC: 4.63 x10E6/uL (ref 3.77–5.28)
RDW: 12.9 % (ref 11.7–15.4)
WBC: 6.1 10*3/uL (ref 3.4–10.8)

## 2023-07-31 LAB — LIPID PANEL
Chol/HDL Ratio: 3.8 ratio (ref 0.0–4.4)
Cholesterol, Total: 174 mg/dL (ref 100–199)
HDL: 46 mg/dL (ref >39)
LDL Chol Calc (NIH): 111 mg/dL — ABNORMAL HIGH (ref 0–99)
Triglycerides: 94 mg/dL (ref 0–149)
VLDL Cholesterol Cal: 17 mg/dL (ref 5–40)

## 2023-07-31 LAB — COMPREHENSIVE METABOLIC PANEL WITH GFR
ALT: 32 IU/L (ref 0–32)
AST: 23 IU/L (ref 0–40)
Albumin: 4.3 g/dL (ref 3.8–4.9)
Alkaline Phosphatase: 123 IU/L — ABNORMAL HIGH (ref 44–121)
BUN/Creatinine Ratio: 11 — ABNORMAL LOW (ref 12–28)
BUN: 10 mg/dL (ref 8–27)
Bilirubin Total: 0.5 mg/dL (ref 0.0–1.2)
CO2: 22 mmol/L (ref 20–29)
Calcium: 9.4 mg/dL (ref 8.7–10.3)
Chloride: 105 mmol/L (ref 96–106)
Creatinine, Ser: 0.88 mg/dL (ref 0.57–1.00)
Globulin, Total: 2.1 g/dL (ref 1.5–4.5)
Glucose: 108 mg/dL — ABNORMAL HIGH (ref 70–99)
Potassium: 4.8 mmol/L (ref 3.5–5.2)
Sodium: 141 mmol/L (ref 134–144)
Total Protein: 6.4 g/dL (ref 6.0–8.5)
eGFR: 75 mL/min/{1.73_m2} (ref >59)

## 2023-07-31 LAB — TSH: TSH: 2.8 u[IU]/mL (ref 0.450–4.500)

## 2023-07-31 LAB — HEMOGLOBIN A1C
Est. average glucose Bld gHb Est-mCnc: 117 mg/dL
Hgb A1c MFr Bld: 5.7 % — ABNORMAL HIGH (ref 4.8–5.6)

## 2023-08-03 ENCOUNTER — Other Ambulatory Visit: Payer: Self-pay | Admitting: Family Medicine

## 2023-08-04 ENCOUNTER — Telehealth: Payer: Self-pay

## 2023-08-04 NOTE — Telephone Encounter (Signed)
 Done via epic  Copied from CRM [id number]. Topic: Medical Record Request - Other >> Aug 04, 2023  2:03 PM Zipporah Him wrote: Reason for CRM: [phone]) [employer] calling in regard to this patient, requests office notes from patients visit 07/30/2023.

## 2023-08-10 ENCOUNTER — Encounter: Payer: Self-pay | Admitting: Physician Assistant

## 2023-08-10 DIAGNOSIS — R030 Elevated blood-pressure reading, without diagnosis of hypertension: Secondary | ICD-10-CM | POA: Insufficient documentation

## 2023-08-10 NOTE — Assessment & Plan Note (Signed)
 Admits to increased back pain that may be causing increased pressure Continue to monitor If staying elevated we will add medication

## 2023-08-10 NOTE — Assessment & Plan Note (Signed)
 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 - Continue anti-inflammatory supplements (turmeric and curcumin). - Consider lumbar injections if pain becomes unmanageable. - Avoid surgery unless necessary.  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 Similar issues at L4-L5 and L5-S1 with arthritis and foraminal stenosis. - Continue anti-inflammatory supplements (turmeric and curcumin). - Consider lumbar injections if pain becomes unmanageable. - Avoid surgery unless necessary.

## 2023-08-10 NOTE — Patient Instructions (Signed)
 VISIT SUMMARY:  During today's visit, we discussed your chronic back pain, which has not improved since your last visit. We reviewed your MRI results and talked about your current treatment plan, including the use of natural supplements. We also discussed your thyroid  medication and recent blood work results.  YOUR PLAN:  -LUMBAR SPONDYLOSIS: Lumbar spondylosis is a type of arthritis in the lower spine that can cause pain and stiffness. We recommend continuing your anti-inflammatory supplements (turmeric and curcumin). If the pain becomes unmanageable, we may consider lumbar injections. Surgery should be avoided unless absolutely necessary.  -LUMBAR DISC DISORDERS WITH RADICULOPATHY: This condition involves issues with the discs in your lower spine that can cause nerve pain. We recommend continuing your anti-inflammatory supplements (turmeric and curcumin). If the pain becomes unmanageable, we may consider lumbar injections. Surgery should be avoided unless absolutely necessary.  -THYROID  DISORDER: Your thyroid  disorder is currently managed with levothyroxine , which has been reduced to 25 mcg. Recent tests show normal thyroid  levels. We will order a thyroid  function test to establish a baseline and may discontinue levothyroxine  if your thyroid  levels remain normal. We will re-evaluate your thyroid  function at your next visit.  -GENERAL HEALTH MAINTENANCE: We discussed routine health maintenance and will order blood tests to check your blood count, cholesterol, A1c, kidney, and liver function.  INSTRUCTIONS:  We will order a thyroid  function test to establish a baseline. If your thyroid  levels are normal, you may discontinue levothyroxine . We will also order blood tests to check your blood count, cholesterol, A1c, kidney, and liver function. Please schedule a follow-up visit to re-evaluate your thyroid  function and discuss the results of your blood tests.

## 2023-08-10 NOTE — Assessment & Plan Note (Signed)
 Well controlled.  Continue to work on eating a healthy diet and exercise.  Labs drawn today.   Will adjust treatment as needed depending on labs Lab Results  Component Value Date   LDLCALC 111 (H) 07/30/2023

## 2023-08-10 NOTE — Assessment & Plan Note (Signed)
 Thyroid  disorder managed with levothyroxine , reduced to 25 mcg. Recent tests show normal thyroid  levels. - Order thyroid  function test to establish baseline. - Discontinue levothyroxine  if thyroid  levels are normal. - Re-evaluate thyroid  function at next visit.

## 2023-08-10 NOTE — Assessment & Plan Note (Signed)
 Controlled Labs drawn today Continue to monitor diet and exercise Will adjust treatment depending on results

## 2023-09-02 ENCOUNTER — Telehealth: Payer: Self-pay

## 2023-09-02 NOTE — Telephone Encounter (Signed)
 Copied from CRM [id number]. Topic: General - Other >> Sep 02, 2023 11:59 AM Phil Braun wrote: Reason for CRM:   Chrystine Crate, [phone] Claim [id number]  The ins company was calling to see if pts disability papers had been received and filled out. Please advise as I did not see anything in the system.

## 2023-10-02 DIAGNOSIS — M549 Dorsalgia, unspecified: Secondary | ICD-10-CM | POA: Diagnosis not present

## 2023-10-09 ENCOUNTER — Other Ambulatory Visit: Payer: Self-pay | Admitting: Physician Assistant

## 2023-10-30 ENCOUNTER — Ambulatory Visit (INDEPENDENT_AMBULATORY_CARE_PROVIDER_SITE_OTHER): Admitting: Physician Assistant

## 2023-10-30 VITALS — BP 110/64 | HR 88 | Temp 97.7°F | Resp 16 | Ht 65.0 in | Wt 230.0 lb

## 2023-10-30 DIAGNOSIS — E782 Mixed hyperlipidemia: Secondary | ICD-10-CM | POA: Diagnosis not present

## 2023-10-30 DIAGNOSIS — J849 Interstitial pulmonary disease, unspecified: Secondary | ICD-10-CM | POA: Diagnosis not present

## 2023-10-30 DIAGNOSIS — E039 Hypothyroidism, unspecified: Secondary | ICD-10-CM | POA: Diagnosis not present

## 2023-10-30 DIAGNOSIS — E66812 Obesity, class 2: Secondary | ICD-10-CM | POA: Insufficient documentation

## 2023-10-30 DIAGNOSIS — M5442 Lumbago with sciatica, left side: Secondary | ICD-10-CM | POA: Diagnosis not present

## 2023-10-30 DIAGNOSIS — M5441 Lumbago with sciatica, right side: Secondary | ICD-10-CM

## 2023-10-30 DIAGNOSIS — G8929 Other chronic pain: Secondary | ICD-10-CM | POA: Diagnosis not present

## 2023-10-30 DIAGNOSIS — R7303 Prediabetes: Secondary | ICD-10-CM | POA: Diagnosis not present

## 2023-10-30 LAB — HEMOGLOBIN A1C
Est. average glucose Bld gHb Est-mCnc: 117 mg/dL
Hgb A1c MFr Bld: 5.7 % — ABNORMAL HIGH (ref 4.8–5.6)

## 2023-10-30 LAB — COMPREHENSIVE METABOLIC PANEL WITH GFR
ALT: 31 IU/L (ref 0–32)
AST: 22 IU/L (ref 0–40)
Albumin: 4.3 g/dL (ref 3.8–4.9)
Alkaline Phosphatase: 130 IU/L — ABNORMAL HIGH (ref 44–121)
BUN/Creatinine Ratio: 13 (ref 12–28)
BUN: 12 mg/dL (ref 8–27)
Bilirubin Total: 0.6 mg/dL (ref 0.0–1.2)
CO2: 21 mmol/L (ref 20–29)
Calcium: 9.2 mg/dL (ref 8.7–10.3)
Chloride: 104 mmol/L (ref 96–106)
Creatinine, Ser: 0.91 mg/dL (ref 0.57–1.00)
Globulin, Total: 2.5 g/dL (ref 1.5–4.5)
Glucose: 112 mg/dL — ABNORMAL HIGH (ref 70–99)
Potassium: 4 mmol/L (ref 3.5–5.2)
Sodium: 142 mmol/L (ref 134–144)
Total Protein: 6.8 g/dL (ref 6.0–8.5)
eGFR: 72 mL/min/1.73 (ref >59)

## 2023-10-30 LAB — TSH: TSH: 4.44 u[IU]/mL (ref 0.450–4.500)

## 2023-10-30 LAB — CBC WITH DIFFERENTIAL/PLATELET
Basophils Absolute: 0 x10E3/uL (ref 0.0–0.2)
Basos: 0 %
EOS (ABSOLUTE): 0.1 x10E3/uL (ref 0.0–0.4)
Eos: 1 %
Hematocrit: 45.1 % (ref 34.0–46.6)
Hemoglobin: 14.4 g/dL (ref 11.1–15.9)
Immature Grans (Abs): 0 x10E3/uL (ref 0.0–0.1)
Immature Granulocytes: 0 %
Lymphocytes Absolute: 2.3 x10E3/uL (ref 0.7–3.1)
Lymphs: 31 %
MCH: 30.4 pg (ref 26.6–33.0)
MCHC: 31.9 g/dL (ref 31.5–35.7)
MCV: 95 fL (ref 79–97)
Monocytes Absolute: 0.6 x10E3/uL (ref 0.1–0.9)
Monocytes: 8 %
Neutrophils Absolute: 4.5 x10E3/uL (ref 1.4–7.0)
Neutrophils: 60 %
Platelets: 361 x10E3/uL (ref 150–450)
RBC: 4.73 x10E6/uL (ref 3.77–5.28)
RDW: 13.3 % (ref 11.7–15.4)
WBC: 7.5 x10E3/uL (ref 3.4–10.8)

## 2023-10-30 LAB — LIPID PANEL
Chol/HDL Ratio: 3.7 ratio (ref 0.0–4.4)
Cholesterol, Total: 164 mg/dL (ref 100–199)
HDL: 44 mg/dL (ref >39)
LDL Chol Calc (NIH): 101 mg/dL — ABNORMAL HIGH (ref 0–99)
Triglycerides: 104 mg/dL (ref 0–149)
VLDL Cholesterol Cal: 19 mg/dL (ref 5–40)

## 2023-10-30 NOTE — Progress Notes (Signed)
 Subjective:  Patient ID: Sonya Francis, female    DOB: [birth year]  Age: 58 y.o. MRN: [id number]  Chief Complaint  Patient presents with   Medical Management of Chronic Issues    HPI:  Discussed the use of AI scribe software for clinical note transcription with the patient, who gave verbal consent to proceed.  History of Present Illness   Sonya Francis is a 58 year old female with chronic back pain who presents for a four month follow-up.  She experiences chronic back pain, primarily located at the bottom of her spine, which worsens with physical activities such as sweeping the floor. The pain is accompanied by soreness and stiffness, especially when lying flat. Sitting for short periods provides temporary relief, allowing her to resume activities. She has not undergone back surgery and has previously completed four weeks of physical therapy without significant improvement. A recent trip to [place]  exacerbated her pain, necessitating a visit to urgent care where she was prescribed three medications that provided temporary relief. She finds minor relief by sleeping with a pillow between her legs. No recent worsening of numbness or tingling.  She experiences breathing difficulties, particularly during exertion such as walking uphill or performing activities. Walking with a shopping cart helps alleviate her back pain. No major shortness of breath, but she needs to pace herself when walking in places like [employer].  She has stopped taking levothyroxine . She mentions being at the end of prediabetes.          10/30/2023   10:30 AM 07/30/2023   10:58 AM 04/02/2023   10:09 AM 12/29/2022   11:04 AM 09/25/2022   11:01 AM  Depression screen PHQ 2/9  Decreased Interest 2 2 1 3  0  Down, Depressed, Hopeless 0 0 0 0 0  PHQ - 2 Score 2 2 1 3  0  Altered sleeping 0 0 0 0 0  Tired, decreased energy 3 2 0 3 3  Change in appetite 0 0 3 0 0  Feeling bad or failure about yourself  0 0 0 0 0  Trouble  concentrating 0 0 0 0 0  Moving slowly or fidgety/restless 0 0 0 0 0  Suicidal thoughts 0 0 0 0 0  PHQ-9 Score 5 4 4 6 3   Difficult doing work/chores Very difficult Not difficult at all Somewhat difficult Not difficult at all Not difficult at all        10/30/2023   10:29 AM  Fall Risk   Falls in the past year? 0  Number falls in past yr: 0  Injury with Fall? 0  Risk for fall due to : No Fall Risks  Follow up Falls evaluation completed    Patient Care Team: Milon Cleaves, [place] as PCP - General (Physician Assistant)   Review of Systems  Constitutional:  Negative for chills, diaphoresis, fatigue and fever.  HENT:  Negative for congestion, ear pain and sinus pain.   Eyes: Negative.   Respiratory:  Negative for cough and shortness of breath.   Cardiovascular:  Positive for palpitations. Negative for chest pain.  Gastrointestinal:  Negative for abdominal pain, constipation, diarrhea, nausea and vomiting.  Endocrine: Negative.   Genitourinary:  Negative for dysuria, frequency and urgency.  Musculoskeletal:  Negative for arthralgias.  Skin: Negative.   Allergic/Immunologic: Negative.   Neurological:  Negative for dizziness, weakness, light-headedness and headaches.  Hematological: Negative.   Psychiatric/Behavioral:  Negative for dysphoric mood. The patient is not nervous/anxious.     Current Outpatient Medications  on File Prior to Visit  Medication Sig Dispense Refill   Apple Cider Vinegar 300 MG TABS Take 600 mg by mouth daily.     fluticasone (FLONASE) 50 MCG/ACT nasal spray Place 1 spray into both nostrils daily. (Patient taking differently: Place 1 spray into both nostrils daily. AS NEEDED)     loratadine  (CLARITIN ) 10 MG tablet TAKE 1 TABLET BY MOUTH EVERY DAY 90 tablet 0   methocarbamol (ROBAXIN) 500 MG tablet Take 500-1,000 mg by mouth every 6 (six) hours as needed.     metoprolol  succinate (TOPROL -XL) 25 MG 24 hr tablet Take 1 tablet (25 mg total) by mouth daily. 90 tablet 3    Multiple Vitamins-Minerals (ONE-A-DAY WOMENS PO) Take by mouth. With vitamin d      OVER THE COUNTER MEDICATION CURA.MED  For  back pain  750mg  take 1 tablet daily.     torsemide  (DEMADEX ) 5 MG tablet TAKE 1 TABLET (5 MG TOTAL) BY MOUTH DAILY. 90 tablet 3   No current facility-administered medications on file prior to visit.   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   Elevated TSH    Tendonitis of ankle or foot 08/13/2015   Past Surgical History:  Procedure Laterality Date   CHOLECYSTECTOMY  09/2021    Family History  Problem Relation Age of Onset   Cancer Mother    Cancer Father    Social History   Socioeconomic History   Marital status: Married    Spouse name: Not on file   Number of children: Not on file   Years of education: Not on file   Highest education level: Not on file  Occupational History   Not on file  Tobacco Use   Smoking status: Never   Smokeless tobacco: Never  Vaping Use   Vaping status: Never Used  Substance and Sexual Activity   Alcohol use: Never   Drug use: Never   Sexual activity: Yes    Partners: Male  Other Topics Concern   Not on file  Social History Narrative   Not on file   Social Drivers of Health   Financial Resource Strain: Low Risk  (12/26/2021)   Overall Financial Resource Strain (CARDIA)    Difficulty of Paying Living Expenses: Not hard at all  Food Insecurity: No Food Insecurity (12/26/2021)   Hunger Vital Sign    Worried About Running Out of Food in the Last Year: Never true    Ran Out of Food in the Last Year: Never true  Transportation Needs: No Transportation Needs (12/26/2021)   PRAPARE - Administrator, Civil Service (Medical): No    Lack of Transportation (Non-Medical): No  Physical Activity: Inactive (09/25/2022)   Exercise Vital Sign    Days of Exercise per Week: 0 days    Minutes of Exercise per Session: 0 min  Stress: No Stress Concern Present (12/26/2021)    [employer] of Occupational Health - Occupational Stress Questionnaire    Feeling of Stress : Not at all  Social Connections: Moderately Isolated (12/26/2021)   Social Connection and Isolation Panel    Frequency of Communication with Friends and Family: More than three times a week    Frequency of Social Gatherings with Friends and Family: More than three times a week    Attends Religious Services: Never    Database administrator or Organizations: No    Attends Banker Meetings: Never    Marital Status: Married  Objective:  BP 110/64   Pulse 88   Temp 97.7 F (36.5 C) (Temporal)   Resp 16   Ht 5' 5 (1.651 m)   Wt 230 lb (104.3 kg)   LMP  (LMP Unknown)   SpO2 98%   BMI 38.27 kg/m      10/30/2023   10:12 AM 07/30/2023   11:18 AM 07/30/2023   10:57 AM  BP/Weight  Systolic BP 110 148 160  Diastolic BP 64 82 82  Wt. (Lbs) 230  232  BMI 38.27 kg/m2  38.61 kg/m2    Physical Exam Vitals reviewed.  Constitutional:      Appearance: Normal appearance.  Neck:     Vascular: No carotid bruit.  Cardiovascular:     Rate and Rhythm: Normal rate and regular rhythm.     Heart sounds: Normal heart sounds.  Pulmonary:     Effort: Pulmonary effort is normal.     Breath sounds: Normal breath sounds.  Abdominal:     General: Bowel sounds are normal.     Palpations: Abdomen is soft.     Tenderness: There is no abdominal tenderness.  Musculoskeletal:     Cervical back: Deformity present. No edema, rigidity, tenderness or bony tenderness. Normal range of motion.     Thoracic back: No edema or signs of trauma. Decreased range of motion.     Lumbar back: Spasms and tenderness present. No edema or bony tenderness. Decreased range of motion. Positive right straight leg raise test and positive left straight leg raise test.     Comments: Possible increased cervical cyphosis Decreased rotation at both lumbar and thoracic levels Normal flexion Decreased extension Decreased  lateral bend with increased severity to right lateral bend No severe neuropathy noted before exam Minor discomfort after exam  Neurological:     Mental Status: She is alert and oriented to person, place, and time.  Psychiatric:        Mood and Affect: Mood normal.        Behavior: Behavior normal.         Lab Results  Component Value Date   WBC 7.5 10/30/2023   HGB 14.4 10/30/2023   HCT 45.1 10/30/2023   PLT 361 10/30/2023   GLUCOSE 112 (H) 10/30/2023   CHOL 164 10/30/2023   TRIG 104 10/30/2023   HDL 44 10/30/2023   LDLCALC 101 (H) 10/30/2023   ALT 31 10/30/2023   AST 22 10/30/2023   NA 142 10/30/2023   K 4.0 10/30/2023   CL 104 10/30/2023   CREATININE 0.91 10/30/2023   BUN 12 10/30/2023   CO2 21 10/30/2023   TSH 4.440 10/30/2023   HGBA1C 5.7 (H) 10/30/2023      Assessment & Plan:  Chronic midline low back pain with bilateral sciatica Assessment & Plan: Chronic low back pain with degenerative changes on MRI. Pain exacerbated by activity. Prefers to avoid narcotics. Discussed potential referral to disability physician. - Consider referral to disability physician if needed. - Encourage use of pillow between legs while sleeping.   Mixed hyperlipidemia Assessment & Plan: Controlled Continue to monitor diet and exercise Labs drawn today Will adjust treatment based on results Lab Results  Component Value Date   LDLCALC 101 (H) 10/30/2023     Orders: -     Lipid panel -     Comprehensive metabolic panel with GFR -     CBC with Differential/Platelet -     TSH -     Hemoglobin A1c  Lung  interstitial disease [facility]) Assessment & Plan: Chronic respiratory issues with exertional dyspnea. Previous oxygen requirement discontinued. Lung specialist advised activity breaks.   Hypothyroidism (acquired) Assessment & Plan: Discontinued levothyroxine . Requires thyroid  function monitoring. - Order thyroid  function tests.   Prediabetes Assessment &  Plan: Controlled Labs drawn today Continue to monitor diet and exercise Will adjust treatment depending on results   General Health Maintenance Routine health maintenance including kidney and liver function monitoring. - Order kidney and liver function tests.  Follow-up Plan for regular follow-up to monitor chronic conditions and adjust treatment. Discussed disability paperwork process. - Schedule follow-up in six months unless conditions worsen. - Prepare disability paperwork for her to pick up next week. - Advise her to call ahead to confirm paperwork is ready.          No orders of the defined types were placed in this encounter.   Orders Placed This Encounter  Procedures   Lipid panel   Comprehensive metabolic panel with GFR   CBC with Differential/Platelet   TSH   Hemoglobin A1c     Follow-up: Return in about 6 months (around 05/01/2024).   I,Angela Taylor,acting as a Neurosurgeon for [employer], PA.,have documented all relevant documentation on the behalf of Nola Angles, PA,as directed by  Nola Angles, PA while in the presence of Nola Angles, [place].   An After Visit Summary was printed and given to the patient.  Nola Angles, [place] [facility] [phone]

## 2023-10-30 NOTE — Patient Instructions (Signed)
 VISIT SUMMARY:  During your visit, we discussed your chronic back pain, breathing difficulties, thyroid  function, prediabetes, and cholesterol levels. We also reviewed your general health maintenance and planned for follow-up care.  YOUR PLAN:  -CHRONIC LOW BACK PAIN WITH DEGENERATIVE CHANGES: You have chronic low back pain with degenerative changes, which means the structures in your lower back are wearing down over time. We discussed the possibility of referring you to a disability physician if needed and provided a note for your disability appeal. Continue using a pillow between your legs while sleeping to help alleviate the pain.  -CHRONIC RESPIRATORY ISSUES: You have chronic respiratory issues that cause difficulty breathing during physical activities. It is important to take breaks during activities to manage your symptoms.  -THYROID  FUNCTION MONITORING: Since you have stopped taking levothyroxine , we need to monitor your thyroid  function to ensure it is working properly. We will order thyroid  function tests.  -PREDIABETES: You are at the end of the prediabetes range, which means your blood sugar levels are higher than normal but not yet high enough to be classified as diabetes. We will order blood glucose tests to monitor your levels.  -HYPERCHOLESTEROLEMIA: You have slightly elevated cholesterol levels, which means there is more cholesterol in your blood than is considered healthy. We will order cholesterol tests to monitor your levels.  -GENERAL HEALTH MAINTENANCE: We will continue with routine health maintenance, including monitoring your kidney and liver function. We will order kidney and liver function tests.  INSTRUCTIONS:  Please schedule a follow-up appointment in six months unless your conditions worsen. We will prepare your disability paperwork for you to pick up next week; please call ahead to confirm it is ready.

## 2023-11-02 ENCOUNTER — Ambulatory Visit: Payer: Self-pay | Admitting: Physician Assistant

## 2023-11-04 ENCOUNTER — Telehealth: Payer: Self-pay

## 2023-11-04 ENCOUNTER — Encounter: Payer: Self-pay | Admitting: Physician Assistant

## 2023-11-04 NOTE — Assessment & Plan Note (Signed)
 Discontinued levothyroxine . Requires thyroid  function monitoring. - Order thyroid  function tests.

## 2023-11-04 NOTE — Assessment & Plan Note (Signed)
 Controlled Labs drawn today Continue to monitor diet and exercise Will adjust treatment depending on results

## 2023-11-04 NOTE — Assessment & Plan Note (Signed)
 Controlled Continue to monitor diet and exercise Labs drawn today Will adjust treatment based on results Lab Results  Component Value Date   LDLCALC 101 (H) 10/30/2023

## 2023-11-04 NOTE — Telephone Encounter (Signed)
 DO you know what letter patient is talking about  Copied from CRM [id number]. Topic: General - Other >> Nov 03, 2023  4:57 PM Turkey B wrote: Reason for CRM: patient called asking if letter is ready for her to pick up. She will be by office tomorrow. Please cb >> Nov 04, 2023  7:45 AM CMA Harlene L wrote: I do not see anything upfront for pickup and I do not see any letters in her chart for 2025.

## 2023-11-04 NOTE — Assessment & Plan Note (Addendum)
 Chronic low back pain with degenerative changes on MRI. Pain exacerbated by activity. Prefers to avoid narcotics. Discussed potential referral to disability physician. - Consider referral to disability physician if needed. - Encourage use of pillow between legs while sleeping.

## 2023-11-04 NOTE — Assessment & Plan Note (Signed)
 Chronic respiratory issues with exertional dyspnea. Previous oxygen requirement discontinued. Lung specialist advised activity breaks.

## 2023-11-04 NOTE — Assessment & Plan Note (Signed)
 Continue to monitor diet and exercise Labs drawn today

## 2023-11-06 ENCOUNTER — Other Ambulatory Visit: Payer: Self-pay | Admitting: Physician Assistant

## 2023-11-06 DIAGNOSIS — R000 Tachycardia, unspecified: Secondary | ICD-10-CM

## 2024-01-01 DIAGNOSIS — M545 Low back pain, unspecified: Secondary | ICD-10-CM | POA: Diagnosis not present

## 2024-02-17 DIAGNOSIS — M47819 Spondylosis without myelopathy or radiculopathy, site unspecified: Secondary | ICD-10-CM | POA: Diagnosis not present

## 2024-02-17 DIAGNOSIS — M4800 Spinal stenosis, site unspecified: Secondary | ICD-10-CM | POA: Diagnosis not present

## 2024-02-17 DIAGNOSIS — M51369 Other intervertebral disc degeneration, lumbar region without mention of lumbar back pain or lower extremity pain: Secondary | ICD-10-CM | POA: Diagnosis not present

## 2024-04-19 ENCOUNTER — Other Ambulatory Visit: Payer: Self-pay | Admitting: Family Medicine

## 2024-04-21 ENCOUNTER — Telehealth: Payer: Self-pay | Admitting: Physician Assistant

## 2024-04-21 NOTE — Telephone Encounter (Signed)
 I left a message on the number(s) listed in the patients chart requesting the patient to call back regarding the upcomming appointment for 05/02/2024. The provider is out of the office that day. The appointment has been canceled. Waiting for the patient to return the call.

## 2024-05-02 ENCOUNTER — Ambulatory Visit: Admitting: Physician Assistant

## 2024-05-04 ENCOUNTER — Ambulatory Visit: Admitting: Physician Assistant

## 2024-05-16 ENCOUNTER — Ambulatory Visit: Admitting: Physician Assistant
